# Patient Record
Sex: Female | Born: 1995 | Race: Black or African American | Hispanic: No | Marital: Single | Smoking: Never smoker
Health system: Southern US, Community
[De-identification: ages and names within clinical notes are randomized; demographics above are authoritative.]

## PROBLEM LIST (undated history)

## (undated) DIAGNOSIS — R011 Cardiac murmur, unspecified: Secondary | ICD-10-CM

## (undated) DIAGNOSIS — O009 Unspecified ectopic pregnancy without intrauterine pregnancy: Secondary | ICD-10-CM

## (undated) HISTORY — DX: Cardiac murmur, unspecified: R01.1

## (undated) HISTORY — DX: Unspecified ectopic pregnancy without intrauterine pregnancy: O00.90

---

## 2017-06-30 ENCOUNTER — Emergency Department: Payer: Medicaid - Out of State

## 2017-06-30 ENCOUNTER — Emergency Department
Admission: EM | Admit: 2017-06-30 | Discharge: 2017-06-30 | Disposition: A | Discharge status: Home / Self Care | Payer: Medicaid - Out of State | Attending: Emergency Medicine | Admitting: Emergency Medicine

## 2017-06-30 ENCOUNTER — Encounter: Payer: Self-pay | Admitting: Emergency Medicine

## 2017-06-30 DIAGNOSIS — Z3A01 Less than 8 weeks gestation of pregnancy: Secondary | ICD-10-CM | POA: Diagnosis not present

## 2017-06-30 DIAGNOSIS — O0000 Abdominal pregnancy without intrauterine pregnancy: Secondary | ICD-10-CM | POA: Insufficient documentation

## 2017-06-30 DIAGNOSIS — O208 Other hemorrhage in early pregnancy: Secondary | ICD-10-CM | POA: Diagnosis present

## 2017-06-30 DIAGNOSIS — O00101 Right tubal pregnancy without intrauterine pregnancy: Secondary | ICD-10-CM

## 2017-06-30 LAB — WET PREP, GENITAL
Clue Cells Wet Prep HPF POC: NONE SEEN
SPERM: NONE SEEN
TRICH WET PREP: NONE SEEN
YEAST WET PREP: NONE SEEN

## 2017-06-30 LAB — POCT PREGNANCY, URINE
PREG TEST UR: POSITIVE — AB
Preg Test, Ur: POSITIVE — AB

## 2017-06-30 LAB — BASIC METABOLIC PANEL
Anion gap: 9 (ref 5–15)
BUN: 10 mg/dL (ref 6–20)
CHLORIDE: 105 mmol/L (ref 101–111)
CO2: 22 mmol/L (ref 22–32)
CREATININE: 0.63 mg/dL (ref 0.44–1.00)
Calcium: 9.5 mg/dL (ref 8.9–10.3)
GFR calc Af Amer: >60 mL/min (ref >60)
GFR calc non Af Amer: >60 mL/min (ref >60)
GLUCOSE: 96 mg/dL (ref 65–99)
Potassium: 3.9 mmol/L (ref 3.5–5.1)
SODIUM: 136 mmol/L (ref 135–145)

## 2017-06-30 LAB — URINALYSIS, COMPLETE (UACMP) WITH MICROSCOPIC
BACTERIA UA: NONE SEEN
BILIRUBIN URINE: NEGATIVE
Glucose, UA: NEGATIVE mg/dL
KETONES UR: 20 mg/dL — AB
Nitrite: NEGATIVE
PROTEIN: 100 mg/dL — AB
SPECIFIC GRAVITY, URINE: 1.03 (ref 1.005–1.030)
pH: 6 (ref 5.0–8.0)

## 2017-06-30 LAB — CBC
HCT: 38.4 % (ref 35.0–47.0)
HEMOGLOBIN: 12.5 g/dL (ref 12.0–16.0)
MCH: 27.4 pg (ref 26.0–34.0)
MCHC: 32.6 g/dL (ref 32.0–36.0)
MCV: 84.3 fL (ref 80.0–100.0)
PLATELETS: 275 10*3/uL (ref 150–440)
RBC: 4.56 MIL/uL (ref 3.80–5.20)
RDW: 14.2 % (ref 11.5–14.5)
WBC: 6.8 10*3/uL (ref 3.6–11.0)

## 2017-06-30 LAB — ABO/RH: ABO/RH(D): A NEG

## 2017-06-30 LAB — CHLAMYDIA/NGC RT PCR (ARMC ONLY)
Chlamydia Tr: NOT DETECTED
N GONORRHOEAE: NOT DETECTED

## 2017-06-30 LAB — ANTIBODY SCREEN: Antibody Screen: NEGATIVE

## 2017-06-30 LAB — HCG, QUANTITATIVE, PREGNANCY: HCG, BETA CHAIN, QUANT, S: 13363 m[IU]/mL — AB (ref <5)

## 2017-06-30 MED ORDER — RHO D IMMUNE GLOBULIN 1500 UNIT/2ML IJ SOSY
300.0000 ug | PREFILLED_SYRINGE | Freq: Once | INTRAMUSCULAR | Status: AC
  Administered 2017-06-30: 300 ug via INTRAMUSCULAR
  Filled 2017-06-30: qty 2

## 2017-06-30 MED ORDER — METHOTREXATE SODIUM CHEMO INJECTION 50 MG/2ML
85.0000 mg | Freq: Once | INTRAMUSCULAR | Status: AC
  Administered 2017-06-30: 85 mg via INTRAMUSCULAR
  Filled 2017-06-30: qty 3.4

## 2017-06-30 MED ORDER — METHOTREXATE INJECTION FOR WOMEN'S HOSPITAL
50.0000 mg/m2 | Freq: Once | INTRAMUSCULAR | Status: DC
  Filled 2017-06-30: qty 1.7

## 2017-06-30 NOTE — ED Provider Notes (Signed)
[facility] Emergency Department Provider Note  ____________________________________________   First MD Initiated Contact with Patient 06/30/17 1822     (approximate)  I have reviewed the triage vital signs and the nursing notes.   HISTORY  Chief Complaint Vaginal Bleeding   HPI Teresa Bishop is a 21 y.o. female who is [redacted] weeks pregnant and a G2 P1 complaining of 3 days of vaginal bleeding and cramping to the lower abdomen.  She says that she has had several clots.  No pain at this time.  No issues with previous pregnancies.  Denies any discharge or burning with urination.  History reviewed. No pertinent past medical history.  There are no active problems to display for this patient.   History reviewed. No pertinent surgical history.  Prior to Admission medications   Not on File    Allergies Codeine  No family history on file.  Social History Social History   Tobacco Use  . Smoking status: Never Smoker  . Smokeless tobacco: Never Used  Substance Use Topics  . Alcohol use: No    Frequency: Never  . Drug use: No    Review of Systems  Constitutional: No fever/chills Eyes: No visual changes. ENT: No sore throat. Cardiovascular: Denies chest pain. Respiratory: Denies shortness of breath. Gastrointestinal: No nausea, no vomiting.  No diarrhea.  No constipation. Genitourinary: As above  musculoskeletal: Negative for back pain. Skin: Negative for rash. Neurological: Negative for headaches, focal weakness or numbness.   ____________________________________________   PHYSICAL EXAM:  VITAL SIGNS: ED Triage Vitals [06/30/17 1738]  Enc Vitals Group     BP 117/72     Pulse Rate 96     Resp 15     Temp 98.5 F (36.9 C)     Temp Source Oral     SpO2 100 %     Weight 140 lb (63.5 kg)     Height 5\' 7"  (1.702 m)     Head Circumference      Peak Flow      Pain Score 7     Pain Loc      Pain Edu?      Excl. in GC?      Constitutional: Alert and oriented. Well appearing and in no acute distress. Eyes: Conjunctivae are normal.  Head: Atraumatic. Nose: No congestion/rhinnorhea. Mouth/Throat: Mucous membranes are moist.  Neck: No stridor.   Cardiovascular: Normal rate, regular rhythm. Grossly normal heart sounds.   Respiratory: Normal respiratory effort.  No retractions. Lungs CTAB. Gastrointestinal: Soft and nontender. No distention. No CVA tenderness. Genitourinary: Normal external exam without any lesions.  Several small blood clots in the vault but without any active bleeding from the cervix on speculum exam.  No discharge visualized.  Bimanual exam with closed cervix.  No CMT.  No uterine or adnexal tenderness nor masses. Musculoskeletal: No lower extremity tenderness nor edema.  No joint effusions. Neurologic:  Normal speech and language. No gross focal neurologic deficits are appreciated. Skin:  Skin is warm, dry and intact. No rash noted. Psychiatric: Mood and affect are normal. Speech and behavior are normal.  ____________________________________________   LABS (all labs ordered are listed, but only abnormal results are displayed)  Labs Reviewed  WET PREP, GENITAL - Abnormal; Notable for the following components:      Result Value   WBC, Wet Prep HPF POC MANY (*)    All other components within normal limits  URINALYSIS, COMPLETE (UACMP) WITH MICROSCOPIC - Abnormal; Notable for the  following components:   Color, Urine YELLOW (*)    APPearance HAZY (*)    Hgb urine dipstick LARGE (*)    Ketones, ur 20 (*)    Protein, ur 100 (*)    Leukocytes, UA TRACE (*)    Squamous Epithelial / LPF 0-5 (*)    All other components within normal limits  HCG, QUANTITATIVE, PREGNANCY - Abnormal; Notable for the following components:   hCG, Beta Chain, Quant, S 13,363 (*)    All other components within normal limits  POCT PREGNANCY, URINE - Abnormal; Notable for the following components:   Preg Test, Ur  POSITIVE (*)    All other components within normal limits  POCT PREGNANCY, URINE - Abnormal; Notable for the following components:   Preg Test, Ur POSITIVE (*)    All other components within normal limits  CHLAMYDIA/NGC RT PCR ([facility] ONLY)  CBC  BASIC METABOLIC PANEL  POC URINE PREG, ED  ABO/RH   ____________________________________________  EKG   ____________________________________________  RADIOLOGY  Patient consistent with ectopic pregnancy in the right fallopian tube or interstitial location.  No free fluid within the pelvis. ____________________________________________   PROCEDURES  Procedure(s) performed:   Procedures  Critical Care performed:   ____________________________________________   INITIAL IMPRESSION / ASSESSMENT AND PLAN / ED COURSE  Pertinent labs & imaging results that were available during my care of the patient were reviewed by me and considered in my medical decision making (see chart for details).  Differential diagnosis includes, but is not limited to, threatened miscarriage, incomplete miscarriage, normal bleeding from an early trimester pregnancy, ectopic pregnancy, , blighted ovum, vaginal/cervical trauma, subchorionic hemorrhage/hematoma, etc.  No previous records on file for review.    ----------------------------------------- 7:30 PM on 06/30/2017 -----------------------------------------  Patient with a negative blood type.  Will be given RhoGam.  Discussed this with the patient and the reason why we are giving it.  Also with trace leukocytes but also with squamous epithelial cells in the urine.  Unlikely to be UTI.  Will hold antibiotics at this time.  ----------------------------------------- 9:14 PM on 06/30/2017 -----------------------------------------  Discussed the case with Dr. Logan BoresEvans of on-call OB/GYN who says that he will be evaluating the CT scan to see if the patient is a methotrexate candidate versus a  candidate for surgery.  The patient says that she does have a history of chlamydia.  I discussed with the diagnosis and the possible treatment plans.  We are awaiting word from Dr. Logan BoresEvans as of now.  ----------------------------------------- 10:14 PM on 06/30/2017 -----------------------------------------  Patient at this time without any further complaints.  She has been evaluated by Dr. Logan BoresEvans in the emergency department who has ordered methotrexate for the patient to be given IM prior to leaving.  He is recommending a repeat draw of her hCG level to Saturday in the emergency department and then follow-up with him this coming Wednesday in the office.  The patient was counseled to return immediately to the emergency department for any worsening or concerning symptoms, especially lower abdominal pain, increased bleeding and lightheadedness.  She is understanding of the plan willing to comply.  The patient was also counseled by Dr. Logan BoresEvans. ____________________________________________   FINAL CLINICAL IMPRESSION(S) / ED DIAGNOSES  Ectopic pregnancy.    NEW MEDICATIONS STARTED DURING THIS VISIT:  This SmartLink is deprecated. Use AVSMEDLIST instead to display the medication list for a patient.   Note:  This document was prepared using Dragon voice recognition software and may include unintentional dictation errors.  Myrna BlazerSchaevitz, Katina Remick Matthew, MD 06/30/17 2215

## 2017-06-30 NOTE — ED Notes (Signed)
Pt to ed with c/o vaginal cramping x 5 days, also vaginal spotting x 3 days.  G2 P1.  Pt reports mild bright red vaginal bleeding mostly when urinating.

## 2017-06-30 NOTE — ED Triage Notes (Signed)
Patient presents to ED via POV from home with c/o vaginal bleeding x 2 days. Patient reports cramping and lower back pain. Patient is [redacted] weeks pregnant. Patient denies saturating pads. Patient does report passing clots.

## 2017-06-30 NOTE — Progress Notes (Signed)
Patient ID: Teresa MoBrandi Minniear, female   DOB: 02/15/1996, 21 y.o.   MRN: [id number]     Consult Note  Requesting Attending Physician :  Myrna BlazerSchaevitz, Katalyn Matin Matthew* Service Requesting Consult : ED  Assessment/Recommendations:  Teresa Bishop is a 21 y.o. female seen in consultation at the request of Pershing ProudSchaevitz, Myra RudeDavid Matthew* regarding possible ectopic pregnancy.   U/S c/w RIGHT tubal ectopic pregnancy.  2.4 cm in diameter - no cardiac activity.  Plan:  Discussed r/b mtx with patient.  All questions answered.  Will give mtx tonight.  Precautions discussed.  Expectations of mtx reviewed.   F/U quant here in ED on Sat. [facility]   @HPIBEGIN @:  Pt presented with vaginal bleeding and lower back pain.   Found to have a positive HCG.  13K.  Pt reports h/o CT - treated in past.   U/S = The uterus is anteverted and appears unremarkable. The endometrium measures 9 mm in thickness. No intrauterine pregnancy identified. Small amount of debris or blood product is noted within the canal.  There is a 2.4 x 2.0 x 2.1 cm complex mass with solid echogenic periphery and central cystic component in the region of the right adnexa close to the right cornua most consistent with an ectopic pregnancy, likely tubal or interstitial in location. A 2 mm linear echogenic structure within the central cystic component of this mass may be artifactual or represent a fetal pole. If this structure is a true fetal pole a corresponds to a 5 weeks, 5 days gestational age. No definite cardiac activity was detected.  The ovaries are unremarkable.  No free fluid within the pelvis.   Obstetric History:  OB History  Gravida Para Term Preterm AB Living  1            SAB TAB Ectopic Multiple Live Births               # Outcome Date GA Lbr Len/2nd Weight Sex Delivery Anes PTL Lv  1 Current               GYN History:  Hx of STIs: Yes   CT  Social History: Social History    Socioeconomic History  . Marital status: Single    Spouse name: None  . Number of children: None  . Years of education: None  . Highest education level: None  Social Needs  . Financial resource strain: None  . Food insecurity - worry: None  . Food insecurity - inability: None  . Transportation needs - medical: None  . Transportation needs - non-medical: None  Occupational History  . None  Tobacco Use  . Smoking status: Never Smoker  . Smokeless tobacco: Never Used  Substance and Sexual Activity  . Alcohol use: No    Frequency: Never  . Drug use: No  . Sexual activity: None  Other Topics Concern  . None  Social History Narrative  . None    Family History: family history is not on file.  Review of Systems: Non-contrib or otherwise noted in HPI.  Objective :  Vital signs in last 24 hours: Temp:  [98.5 F (36.9 C)] 98.5 F (36.9 C) (11/20 1738) Pulse Rate:  [96] 96 (11/20 1738) Resp:  [15] 15 (11/20 1738) BP: (117)/(72) 117/72 (11/20 1738) SpO2:  [100 %] 100 % (11/20 1738) Weight:  [140 lb (63.5 kg)] 140 lb (63.5 kg) (11/20 1738)  Intake/Output last 3 shifts: No intake/output  data recorded.   Physical Exam: Abd:  Soft nt, no rebound or guarding no masses noted.  CMP noted.  Elonda Huskyavid J. Donavon Kimrey, M.D. 06/30/2017 9:56 PM

## 2017-06-30 NOTE — ED Notes (Signed)
Patient states she has not been to her OBGYN. Patient states, "We weren't going to go through with even having it".

## 2017-07-01 LAB — RHOGAM INJECTION: Unit division: 0

## 2017-07-04 ENCOUNTER — Emergency Department
Admission: EM | Admit: 2017-07-04 | Discharge: 2017-07-04 | Disposition: A | Discharge status: Home / Self Care | Payer: Medicaid - Out of State | Attending: Emergency Medicine | Admitting: Emergency Medicine

## 2017-07-04 ENCOUNTER — Other Ambulatory Visit: Payer: Self-pay

## 2017-07-04 ENCOUNTER — Encounter: Payer: Self-pay | Admitting: Emergency Medicine

## 2017-07-04 DIAGNOSIS — Z3A01 Less than 8 weeks gestation of pregnancy: Secondary | ICD-10-CM | POA: Diagnosis not present

## 2017-07-04 DIAGNOSIS — O209 Hemorrhage in early pregnancy, unspecified: Secondary | ICD-10-CM | POA: Insufficient documentation

## 2017-07-04 DIAGNOSIS — O009 Unspecified ectopic pregnancy without intrauterine pregnancy: Secondary | ICD-10-CM | POA: Diagnosis not present

## 2017-07-04 DIAGNOSIS — R1030 Lower abdominal pain, unspecified: Secondary | ICD-10-CM | POA: Diagnosis present

## 2017-07-04 DIAGNOSIS — Z885 Allergy status to narcotic agent status: Secondary | ICD-10-CM | POA: Insufficient documentation

## 2017-07-04 LAB — HCG, QUANTITATIVE, PREGNANCY: hCG, Beta Chain, Quant, S: 16832 m[IU]/mL — ABNORMAL HIGH (ref <5)

## 2017-07-04 MED ORDER — ACETAMINOPHEN 500 MG PO TABS
1000.0000 mg | ORAL_TABLET | Freq: Once | ORAL | Status: AC
  Administered 2017-07-04: 1000 mg via ORAL
  Filled 2017-07-04: qty 2

## 2017-07-04 MED ORDER — ONDANSETRON HCL 4 MG PO TABS: 4.0000 mg | ORAL_TABLET | Freq: Every day | ORAL | 1 refills | Status: DC | PRN

## 2017-07-04 MED ORDER — ONDANSETRON 4 MG PO TBDP
4.0000 mg | ORAL_TABLET | Freq: Once | ORAL | Status: AC
  Administered 2017-07-04: 4 mg via ORAL
  Filled 2017-07-04: qty 1

## 2017-07-04 MED ORDER — OXYCODONE-ACETAMINOPHEN 5-325 MG PO TABS: 1.0000 | ORAL_TABLET | Freq: Four times a day (QID) | ORAL | 0 refills | Status: DC | PRN

## 2017-07-04 NOTE — ED Provider Notes (Signed)
[facility] Emergency Department Provider Note       Time seen: ----------------------------------------- 11:34 AM on 07/04/2017 -----------------------------------------   I have reviewed the triage vital signs and the nursing notes.  HISTORY   Chief Complaint Abdominal Pain    HPI Teresa Bishop is a 21 y.o. female with a history of ectopic pregnancy who presents to the ED for reevaluation.  Patient states she [redacted] weeks pregnant was here recently for abdominal pain and vaginal bleeding 4 days ago.  She was given methotrexate and was supposed to come for a repeat hCG today.  She is G2 P1 AB 0.  She has had some nausea and mild pain today but otherwise denies complaints.  History reviewed. No pertinent past medical history.  There are no active problems to display for this patient.   History reviewed. No pertinent surgical history.  Allergies Codeine  Social History Social History   Tobacco Use  . Smoking status: Never Smoker  . Smokeless tobacco: Never Used  Substance Use Topics  . Alcohol use: No    Frequency: Never  . Drug use: No   Review of Systems Constitutional: Negative for fever. Cardiovascular: Negative for chest pain. Respiratory: Negative for shortness of breath. Gastrointestinal: Positive for mild abdominal pain and nausea Genitourinary: Negative for dysuria. Musculoskeletal: Negative for back pain. Skin: Negative for rash. Neurological: Negative for headaches, focal weakness or numbness.  All systems negative/normal/unremarkable except as stated in the HPI  ____________________________________________   PHYSICAL EXAM:  VITAL SIGNS: ED Triage Vitals  Enc Vitals Group     BP 07/04/17 1034 113/82     Pulse Rate 07/04/17 1034 95     Resp 07/04/17 1034 18     Temp 07/04/17 1034 98.1 F (36.7 C)     Temp Source 07/04/17 1034 Oral     SpO2 07/04/17 1034 99 %     Weight 07/04/17 1035 140 lb (63.5 kg)     Height 07/04/17  1035 5\' 7"  (1.702 m)     Head Circumference --      Peak Flow --      Pain Score 07/04/17 1034 3     Pain Loc --      Pain Edu? --      Excl. in GC? --     Constitutional: Alert and oriented. Well appearing and in no distress. Eyes: Conjunctivae are normal. Normal extraocular movements. Cardiovascular: Normal rate, regular rhythm. No murmurs, rubs, or gallops. Respiratory: Normal respiratory effort without tachypnea nor retractions. Breath sounds are clear and equal bilaterally. No wheezes/rales/rhonchi. Gastrointestinal: Soft and nontender. Normal bowel sounds Musculoskeletal: Nontender with normal range of motion in extremities. No lower extremity tenderness nor edema. Neurologic:  Normal speech and language. No gross focal neurologic deficits are appreciated.  Skin:  Skin is warm, dry and intact. No rash noted. Psychiatric: Mood and affect are normal. Speech and behavior are normal.  ____________________________________________  ED COURSE:  Pertinent labs & imaging results that were available during my care of the patient were reviewed by me and considered in my medical decision making (see chart for details). Patient presents for reevaluation for ectopic pregnancy, we will assess with labs as indicated   Procedures ____________________________________________   LABS (pertinent positives/negatives)  Labs Reviewed  HCG, QUANTITATIVE, PREGNANCY - Abnormal; Notable for the following components:      Result Value   hCG, Beta Chain, Quant, S 16,832 (*)    All other components within normal limits  ____________________________________________  DIFFERENTIAL  DIAGNOSIS   Ectopic, PID, TOA, normal IUP, ovarian cyst  FINAL ASSESSMENT AND PLAN  Ectopic pregnancy   Plan: Patient had presented for reevaluation for recent ectopic pregnancy with methotrexate treatment. Patient's labs do reveal a quantitative hCG that is not doubling normally.  I discussed with Dr. Logan BoresEvans will follow  up with the patient on Tuesday in his office for a blood draw and Wednesday for evaluation.   Emily FilbertWilliams, Daylen Hack E, MD   Note: This note was generated in part or whole with voice recognition software. Voice recognition is usually quite accurate but there are transcription errors that can and very often do occur. I apologize for any typographical errors that were not detected and corrected.     Emily FilbertWilliams, Lilo Wallington E, MD 07/04/17 [phone]

## 2017-07-04 NOTE — ED Triage Notes (Addendum)
[redacted] weeks pregnant, was for abdominal pain and vag bleeding 4 days ago. Told to return for repeat HCG. States was given methotrexate for probable ectopic pregnancy.

## 2017-07-04 NOTE — ED Notes (Signed)
Pt reports she was here recently for possible ectopic pregnancy. States she is here today to get her hcg levels checked.  Pt is G2.

## 2017-07-07 ENCOUNTER — Telehealth: Payer: Self-pay

## 2017-07-07 ENCOUNTER — Other Ambulatory Visit: Payer: Medicaid - Out of State

## 2017-07-07 ENCOUNTER — Other Ambulatory Visit: Payer: Self-pay | Admitting: Obstetrics and Gynecology

## 2017-07-07 DIAGNOSIS — O009 Unspecified ectopic pregnancy without intrauterine pregnancy: Secondary | ICD-10-CM

## 2017-07-07 NOTE — Telephone Encounter (Signed)
Ordered Beta Quant

## 2017-07-08 ENCOUNTER — Encounter: Payer: Medicaid - Out of State | Admitting: Obstetrics and Gynecology

## 2017-07-08 LAB — BETA HCG QUANT (REF LAB): hCG Quant: 13526 m[IU]/mL

## 2017-07-09 ENCOUNTER — Ambulatory Visit (INDEPENDENT_AMBULATORY_CARE_PROVIDER_SITE_OTHER): Payer: Medicaid - Out of State | Admitting: Obstetrics and Gynecology

## 2017-07-09 ENCOUNTER — Encounter: Payer: Self-pay | Admitting: Obstetrics and Gynecology

## 2017-07-09 ENCOUNTER — Telehealth: Payer: Self-pay

## 2017-07-09 VITALS — BP 100/55 | HR 72 | Ht 67.0 in | Wt 137.1 lb

## 2017-07-09 DIAGNOSIS — O00109 Unspecified tubal pregnancy without intrauterine pregnancy: Secondary | ICD-10-CM

## 2017-07-09 NOTE — Telephone Encounter (Signed)
-----   Message from Linzie Collinavid James Evans, MD sent at 07/09/2017  9:06 AM EST ----- Day 4 = 16,832               Day 7 = 13,526                     @ 20% fall -  This is adequate, pt will need f/u quant in 1 week and a visit with me.

## 2017-07-09 NOTE — Progress Notes (Signed)
HPI:      [name] is a 21 y.o. G1P0 who LMP was Patient's last menstrual period was 05/12/2017.  Subjective:   She presents today after being diagnosed with an ectopic pregnancy and treated with methotrexate 1 week ago.  Patient had quantitative beta hCGs drawn appropriately. She reports that she has begun to have vaginal bleeding and has some back pain but is otherwise doing well. She reports that when this is resolved she would like an IUD for birth control.    Hx: The following portions of the patient's history were reviewed and updated as appropriate:             She  has no past medical history on file. She does not have a problem list on file. She  has no past surgical history on file. Her family history is not on file. She  reports that  has never smoked. she has never used smokeless tobacco. She reports that she does not drink alcohol or use drugs. She is allergic to codeine.       Review of Systems:  Review of Systems  Constitutional: Denied constitutional symptoms, night sweats, recent illness, fatigue, fever, insomnia and weight loss.  Eyes: Denied eye symptoms, eye pain, photophobia, vision change and visual disturbance.  Ears/Nose/Throat/Neck: Denied ear, nose, throat or neck symptoms, hearing loss, nasal discharge, sinus congestion and sore throat.  Cardiovascular: Denied cardiovascular symptoms, arrhythmia, chest pain/pressure, edema, exercise intolerance, orthopnea and palpitations.  Respiratory: Denied pulmonary symptoms, asthma, pleuritic pain, productive sputum, cough, dyspnea and wheezing.  Gastrointestinal: Denied, gastro-esophageal reflux, melena, nausea and vomiting.  Genitourinary: Denied genitourinary symptoms including symptomatic vaginal discharge, pelvic relaxation issues, and urinary complaints.  Musculoskeletal: Denied musculoskeletal symptoms, stiffness, swelling, muscle weakness and myalgia.  Dermatologic: Denied dermatology symptoms, rash and  scar.  Neurologic: Denied neurology symptoms, dizziness, headache, neck pain and syncope.  Psychiatric: Denied psychiatric symptoms, anxiety and depression.  Endocrine: Denied endocrine symptoms including hot flashes and night sweats.   Meds:   Current Outpatient Medications on File Prior to Visit  Medication Sig Dispense Refill  . ondansetron (ZOFRAN) 4 MG tablet Take 1 tablet (4 mg total) by mouth daily as needed for nausea or vomiting. 20 tablet 1   No current facility-administered medications on file prior to visit.     Objective:     Vitals:   07/09/17 1130  BP: (!) 100/55  Pulse: 72    Quantitative beta hCGs reviewed and discussed directly with the patient.           Assessment:    G1P0 There are no active problems to display for this patient.    1. Tubal pregnancy without intrauterine pregnancy, unspecified laterality     Patient doing well after receiving methotrexate.  Quantitative beta hCG is dropping appropriately.  (20%)   Plan:            1.  Continue to follow weekly beta hCGs until 0.  2.  I have discussed this in detail with the patient.  She understands the necessity of continued surveillance. Orders No orders of the defined types were placed in this encounter.   No orders of the defined types were placed in this encounter.     F/U  Return for We will contact her with any abnormal test results. I spent 18 minutes with this patient of which greater than 50% was spent discussing ectopic pregnancy, quantitative beta hCG results, future follow-up, future birth control.  All patient's questions  answered.  Elonda Huskyavid J. Natania Finigan, M.D. 07/09/2017 11:58 AM

## 2017-07-09 NOTE — Telephone Encounter (Signed)
Pt will keep todays appointment.

## 2017-07-18 ENCOUNTER — Emergency Department: Payer: Medicaid - Out of State

## 2017-07-18 ENCOUNTER — Emergency Department
Admission: EM | Admit: 2017-07-18 | Discharge: 2017-07-18 | Disposition: A | Discharge status: Home / Self Care | Payer: Medicaid - Out of State | Attending: Emergency Medicine | Admitting: Emergency Medicine

## 2017-07-18 DIAGNOSIS — R1031 Right lower quadrant pain: Secondary | ICD-10-CM | POA: Insufficient documentation

## 2017-07-18 DIAGNOSIS — O00201 Right ovarian pregnancy without intrauterine pregnancy: Secondary | ICD-10-CM | POA: Insufficient documentation

## 2017-07-18 DIAGNOSIS — R1084 Generalized abdominal pain: Secondary | ICD-10-CM

## 2017-07-18 LAB — COMPREHENSIVE METABOLIC PANEL
ALBUMIN: 3.9 g/dL (ref 3.5–5.0)
ALK PHOS: 39 U/L (ref 38–126)
ALT: 12 U/L — AB (ref 14–54)
AST: 17 U/L (ref 15–41)
Anion gap: 7 (ref 5–15)
BILIRUBIN TOTAL: 0.5 mg/dL (ref 0.3–1.2)
BUN: 10 mg/dL (ref 6–20)
CALCIUM: 8.9 mg/dL (ref 8.9–10.3)
CO2: 23 mmol/L (ref 22–32)
CREATININE: 0.64 mg/dL (ref 0.44–1.00)
Chloride: 107 mmol/L (ref 101–111)
GFR calc Af Amer: >60 mL/min (ref >60)
GLUCOSE: 91 mg/dL (ref 65–99)
Potassium: 3.3 mmol/L — ABNORMAL LOW (ref 3.5–5.1)
Sodium: 137 mmol/L (ref 135–145)
TOTAL PROTEIN: 7 g/dL (ref 6.5–8.1)

## 2017-07-18 LAB — CBC
HEMATOCRIT: 33.4 % — AB (ref 35.0–47.0)
Hemoglobin: 11.2 g/dL — ABNORMAL LOW (ref 12.0–16.0)
MCH: 28.1 pg (ref 26.0–34.0)
MCHC: 33.6 g/dL (ref 32.0–36.0)
MCV: 83.7 fL (ref 80.0–100.0)
PLATELETS: 267 10*3/uL (ref 150–440)
RBC: 3.99 MIL/uL (ref 3.80–5.20)
RDW: 14.3 % (ref 11.5–14.5)
WBC: 5.2 10*3/uL (ref 3.6–11.0)

## 2017-07-18 LAB — HCG, QUANTITATIVE, PREGNANCY: HCG, BETA CHAIN, QUANT, S: 5732 m[IU]/mL — AB (ref <5)

## 2017-07-18 NOTE — Discharge Instructions (Signed)
It is critically important that you follow-up with the [facility] gynecologist this coming week for recheck.  Return to the emergency department sooner for any concerns whatsoever.  It was a pleasure to take care of you today, and thank you for coming to our emergency department.  If you have any questions or concerns before leaving please ask the nurse to grab me and I'm more than happy to go through your aftercare instructions again.  If you were prescribed any opioid pain medication today such as Norco, Vicodin, Percocet, morphine, hydrocodone, or oxycodone please make sure you do not drive when you are taking this medication as it can alter your ability to drive safely.  If you have any concerns once you are home that you are not improving or are in fact getting worse before you can make it to your follow-up appointment, please do not hesitate to call 911 and come back for further evaluation.  Merrily Brittle, MD  Results for orders placed or performed during the hospital encounter of 07/18/17  Comprehensive metabolic panel  Result Value Ref Range   Sodium 137 135 - 145 mmol/L   Potassium 3.3 (L) 3.5 - 5.1 mmol/L   Chloride 107 101 - 111 mmol/L   CO2 23 22 - 32 mmol/L   Glucose, Bld 91 65 - 99 mg/dL   BUN 10 6 - 20 mg/dL   Creatinine, Ser 5.28 0.44 - 1.00 mg/dL   Calcium 8.9 8.9 - 41.3 mg/dL   Total Protein 7.0 6.5 - 8.1 g/dL   Albumin 3.9 3.5 - 5.0 g/dL   AST 17 15 - 41 U/L   ALT 12 (L) 14 - 54 U/L   Alkaline Phosphatase 39 38 - 126 U/L   Total Bilirubin 0.5 0.3 - 1.2 mg/dL   GFR calc non Af Amer >60 >60 mL/min   GFR calc Af Amer >60 >60 mL/min   Anion gap 7 5 - 15  CBC  Result Value Ref Range   WBC 5.2 3.6 - 11.0 K/uL   RBC 3.99 3.80 - 5.20 MIL/uL   Hemoglobin 11.2 (L) 12.0 - 16.0 g/dL   HCT 24.4 (L) 01.0 - 27.2 %   MCV 83.7 80.0 - 100.0 fL   MCH 28.1 26.0 - 34.0 pg   MCHC 33.6 32.0 - 36.0 g/dL   RDW 53.6 64.4 - 03.4 %   Platelets 267 150 - 440 K/uL  hCG, quantitative, pregnancy   Result Value Ref Range   hCG, Beta Chain, Quant, S 5,732 (H) <5 mIU/mL   US Ob Comp Less 14 Wks  Result Date: 07/18/2017 CLINICAL DATA:  Ectopic pregnancy diagnosis 06/30/2017. Methotrexate treatment on 06/30/2017. Continued RIGHT lower quadrant pain. EXAM: OBSTETRIC <14 WK [place] AND TRANSVAGINAL OB US TECHNIQUE: Both transabdominal and transvaginal ultrasound examinations were performed for complete evaluation of the gestation as well as the maternal uterus, adnexal regions, and pelvic cul-de-sac. Transvaginal technique was performed to assess early pregnancy. COMPARISON:  Ultrasound 06/30/2017 FINDINGS: Intrauterine gestational sac: Not identified Yolk sac:  Not identified Embryo:  Not identified Subchorionic hemorrhage:  None visualized. Maternal uterus/adnexae: Rounded mass in the RIGHT adnexa is again demonstrated with mild of blood flow on color Doppler imaging. This mass measures 3.6 x 3.3 x 3.5 cm with a central sac-like component measuring 7 mm. The mass is in the same location as the presumed ectopic pregnancy on comparison exam. Mass measured 2.4 x 2.0 x 2.1 cm on comparison ultrasound with a 7 mm central sac-like lesion. The  vascularity was slightly higher on comparison exam. Overall this mass lesion of the RIGHT adnexa appears very similar although slightly enlarged. The mass is immediately adjacent to the uterus and may be within the cornu. The uterus appears to extend partially around the mass. No free-fluid. IMPRESSION: Persistent mass in the RIGHT adnexa / RIGHT cornu of the uterus with central sac-like structure and persistent vascularity. The size of lesion is increased mildly in the interval. The central sac-like portion is unchanged. Differential would include adnexal ectopic pregnancy versus corneal ectopic pregnancy. With mass lesion contiguous with the uterus would also consider leiomyoma. No free-fluid. Recommend OB GYN consultation. Electronically Signed   By: Genevive BiStewart  Edmunds M.D.    On: 07/18/2017 09:19   [place] Ob Comp Less 14 Wks  Result Date: 06/30/2017 CLINICAL DATA:  21 year old pregnant female with right lower quadrant pain and vaginal bleeding. LMP: 05/12/2017 corresponding to an estimated gestational age of [redacted] weeks, 0 days. EXAM: OBSTETRIC <14 WK US AND TRANSVAGINAL OB US TECHNIQUE: Both transabdominal and transvaginal ultrasound examinations were performed for complete evaluation of the gestation as well as the maternal uterus, adnexal regions, and pelvic cul-de-sac. Transvaginal technique was performed to assess early pregnancy. COMPARISON:  None. FINDINGS: The uterus is anteverted and appears unremarkable. The endometrium measures 9 mm in thickness. No intrauterine pregnancy identified. Small amount of debris or blood product is noted within the canal. There is a 2.4 x 2.0 x 2.1 cm complex mass with solid echogenic periphery and central cystic component in the region of the right adnexa close to the right cornua most consistent with an ectopic pregnancy, likely tubal or interstitial in location. A 2 mm linear echogenic structure within the central cystic component of this mass may be artifactual or represent a fetal pole. If this structure is a true fetal pole a corresponds to a 5 weeks, 5 days gestational age. No definite cardiac activity was detected. The ovaries are unremarkable. No free fluid within the pelvis. IMPRESSION: Findings most consistent with an ectopic pregnancy likely in the right fallopian tube or interstitial location. Clinical correlation and obstetrical consult is advised. A linear echogenic structure within the ectopic pregnancy may be artifactual or represent a fetal pole with an estimated gestational age of [redacted] weeks, 5 days. No cardiac activity. No free fluid within the pelvis. These results were called by telephone at the time of interpretation on 06/30/2017 at 8:49 pm to Dr. Gladstone PihAVID SCHAEVITZ , who verbally acknowledged these results. Electronically Signed    By: Elgie CollardArash  Radparvar M.D.   On: 06/30/2017 20:55   [place] Ob Transvaginal  Result Date: 07/18/2017 CLINICAL DATA:  Ectopic pregnancy diagnosis 06/30/2017. Methotrexate treatment on 06/30/2017. Continued RIGHT lower quadrant pain. EXAM: OBSTETRIC <14 WK US AND TRANSVAGINAL OB US TECHNIQUE: Both transabdominal and transvaginal ultrasound examinations were performed for complete evaluation of the gestation as well as the maternal uterus, adnexal regions, and pelvic cul-de-sac. Transvaginal technique was performed to assess early pregnancy. COMPARISON:  Ultrasound 06/30/2017 FINDINGS: Intrauterine gestational sac: Not identified Yolk sac:  Not identified Embryo:  Not identified Subchorionic hemorrhage:  None visualized. Maternal uterus/adnexae: Rounded mass in the RIGHT adnexa is again demonstrated with mild of blood flow on color Doppler imaging. This mass measures 3.6 x 3.3 x 3.5 cm with a central sac-like component measuring 7 mm. The mass is in the same location as the presumed ectopic pregnancy on comparison exam. Mass measured 2.4 x 2.0 x 2.1 cm on comparison ultrasound with a 7 mm central sac-like  lesion. The vascularity was slightly higher on comparison exam. Overall this mass lesion of the RIGHT adnexa appears very similar although slightly enlarged. The mass is immediately adjacent to the uterus and may be within the cornu. The uterus appears to extend partially around the mass. No free-fluid. IMPRESSION: Persistent mass in the RIGHT adnexa / RIGHT cornu of the uterus with central sac-like structure and persistent vascularity. The size of lesion is increased mildly in the interval. The central sac-like portion is unchanged. Differential would include adnexal ectopic pregnancy versus corneal ectopic pregnancy. With mass lesion contiguous with the uterus would also consider leiomyoma. No free-fluid. Recommend OB GYN consultation. Electronically Signed   By: Genevive BiStewart  Edmunds M.D.   On: 07/18/2017 09:19   [place] Ob  Transvaginal  Result Date: 06/30/2017 CLINICAL DATA:  21 year old pregnant female with right lower quadrant pain and vaginal bleeding. LMP: 05/12/2017 corresponding to an estimated gestational age of [redacted] weeks, 0 days. EXAM: OBSTETRIC <14 WK US AND TRANSVAGINAL OB US TECHNIQUE: Both transabdominal and transvaginal ultrasound examinations were performed for complete evaluation of the gestation as well as the maternal uterus, adnexal regions, and pelvic cul-de-sac. Transvaginal technique was performed to assess early pregnancy. COMPARISON:  None. FINDINGS: The uterus is anteverted and appears unremarkable. The endometrium measures 9 mm in thickness. No intrauterine pregnancy identified. Small amount of debris or blood product is noted within the canal. There is a 2.4 x 2.0 x 2.1 cm complex mass with solid echogenic periphery and central cystic component in the region of the right adnexa close to the right cornua most consistent with an ectopic pregnancy, likely tubal or interstitial in location. A 2 mm linear echogenic structure within the central cystic component of this mass may be artifactual or represent a fetal pole. If this structure is a true fetal pole a corresponds to a 5 weeks, 5 days gestational age. No definite cardiac activity was detected. The ovaries are unremarkable. No free fluid within the pelvis. IMPRESSION: Findings most consistent with an ectopic pregnancy likely in the right fallopian tube or interstitial location. Clinical correlation and obstetrical consult is advised. A linear echogenic structure within the ectopic pregnancy may be artifactual or represent a fetal pole with an estimated gestational age of [redacted] weeks, 5 days. No cardiac activity. No free fluid within the pelvis. These results were called by telephone at the time of interpretation on 06/30/2017 at 8:49 pm to Dr. Gladstone PihAVID SCHAEVITZ , who verbally acknowledged these results. Electronically Signed   By: Elgie CollardArash  Radparvar M.D.   On:  06/30/2017 20:55

## 2017-07-18 NOTE — ED Provider Notes (Signed)
[facility] Emergency Department Provider Note  ____________________________________________   None    (approximate)  I have reviewed the triage vital signs and the nursing notes.   HISTORY  Chief Complaint Abdominal Pain   HPI Teresa Bishop is a 21 y.o. female who self presents to the emergency department with several days of right lower abdominal discomfort radiating down her right leg.  No numbness or weakness.  Pain was insidious in onset.  Seems to be slightly worse with movement.  3 weeks ago she had a right-sided tubal ectopic pregnancy that was treated with methotrexate.  She had a subsequent follow-up with her OB gynecologist however has been lost to follow-up.  She had no fevers or chills.  No back pain.  No vaginal bleeding or dysuria.  Her pain is moderate severity and intermittent.  No past medical history on file.  There are no active problems to display for this patient.   No past surgical history on file.  Prior to Admission medications   Medication Sig Start Date End Date Taking? Authorizing Provider  ondansetron (ZOFRAN) 4 MG tablet Take 1 tablet (4 mg total) by mouth daily as needed for nausea or vomiting. 07/04/17   Emily FilbertWilliams, Jonathan E, MD    Allergies Codeine  No family history on file.  Social History Social History   Tobacco Use  . Smoking status: Never Smoker  . Smokeless tobacco: Never Used  Substance Use Topics  . Alcohol use: No    Frequency: Never  . Drug use: No    Review of Systems Constitutional: No fever/chills Eyes: No visual changes. ENT: No sore throat. Cardiovascular: Denies chest pain. Respiratory: Denies shortness of breath. Gastrointestinal: Positive for abdominal pain.  No nausea, no vomiting.  No diarrhea.  No constipation. Genitourinary: Negative for dysuria. Musculoskeletal: Negative for back pain. Skin: Negative for rash. Neurological: Negative for headaches, focal weakness or  numbness.   ____________________________________________   PHYSICAL EXAM:  VITAL SIGNS: ED Triage Vitals  Enc Vitals Group     BP 07/18/17 0600 113/65     Pulse Rate 07/18/17 0600 91     Resp 07/18/17 0600 16     Temp 07/18/17 0600 98.5 F (36.9 C)     Temp Source 07/18/17 0600 Oral     SpO2 07/18/17 0600 100 %     Weight 07/18/17 0601 137 lb (62.1 kg)     Height 07/18/17 0601 5\' 7"  (1.702 m)     Head Circumference --      Peak Flow --      Pain Score 07/18/17 0600 6     Pain Loc --      Pain Edu? --      Excl. in GC? --     Constitutional: Alert and oriented x4 joking laughing well-appearing nontoxic no diaphoresis speaks in full clear sentences Eyes: PERRL EOMI. Head: Atraumatic. Nose: No congestion/rhinnorhea. Mouth/Throat: No trismus Neck: No stridor.   Cardiovascular: Normal rate, regular rhythm. Grossly normal heart sounds.  Good peripheral circulation. Respiratory: Normal respiratory effort.  No retractions. Lungs CTAB and moving good air Gastrointestinal: Soft nondistended mild lower abdominal discomfort with no rebound or guarding no peritonitis no focality Musculoskeletal: No lower extremity edema   Neurologic:  Normal speech and language. No gross focal neurologic deficits are appreciated. Skin:  Skin is warm, dry and intact. No rash noted. Psychiatric: Mood and affect are normal. Speech and behavior are normal.    ____________________________________________   DIFFERENTIAL includes but  not limited to  Ectopic pregnancy, appendicitis, diverticulitis, urinary tract infection ____________________________________________   LABS (all labs ordered are listed, but only abnormal results are displayed)  Labs Reviewed  COMPREHENSIVE METABOLIC PANEL - Abnormal; Notable for the following components:      Result Value   Potassium 3.3 (*)    ALT 12 (*)    All other components within normal limits  CBC - Abnormal; Notable for the following components:    Hemoglobin 11.2 (*)    HCT 33.4 (*)    All other components within normal limits  HCG, QUANTITATIVE, PREGNANCY - Abnormal; Notable for the following components:   hCG, Beta Chain, Quant, S 5,732 (*)    All other components within normal limits    Blood work reviewed by me shows appropriately dropping hCG __________________________________________  EKG   ____________________________________________  RADIOLOGY  Pelvic ultrasound reviewed by me shows persistent mass in the right tube ____________________________________________   PROCEDURES  Procedure(s) performed: no  Procedures  Critical Care performed: no  Observation: no ____________________________________________   INITIAL IMPRESSION / ASSESSMENT AND PLAN / ED COURSE  Pertinent labs & imaging results that were available during my care of the patient were reviewed by me and considered in my medical decision making (see chart for details).  The patient arrives hemodynamically stable well-appearing with a relatively benign abdominal exam.  Her beta hCG is dropping and her pelvic ultrasound shows a persistent mass.  I will reach out to her OB gynecologist Dr. Logan BoresEvans now.  I discussed with Dr. Logan BoresEvans who feels that the patient's hCG is dropping appropriately and her ultrasound findings are consistent with what he would expect.  He would like to see the patient in clinic later on this week for recheck.  Strict return precautions have been given and the patient verbalized understanding and agreement with the plan.  She is able to eat and drink.      ____________________________________________   FINAL CLINICAL IMPRESSION(S) / ED DIAGNOSES  Final diagnoses:  Generalized abdominal pain  Right ovarian pregnancy without intrauterine pregnancy      NEW MEDICATIONS STARTED DURING THIS VISIT:  This SmartLink is deprecated. Use AVSMEDLIST instead to display the medication list for a patient.   Note:  This document was  prepared using Dragon voice recognition software and may include unintentional dictation errors.     Merrily Brittleifenbark, Doniel Maiello, MD 07/19/17 [phone]

## 2017-07-18 NOTE — ED Notes (Signed)
Patient transported to Ultrasound 

## 2017-07-18 NOTE — ED Triage Notes (Signed)
Pt states recent right sided ectopic pregnancy treated with methotrexate. Pt states she has right sided lower abd pain from rlq down anterior right leg to knee. Pt denies vomiting, diarrhea, states she has had nausea.

## 2017-07-26 ENCOUNTER — Encounter: Payer: Self-pay | Admitting: Emergency Medicine

## 2017-07-26 ENCOUNTER — Encounter: Payer: Self-pay | Admitting: Anesthesiology

## 2017-07-26 ENCOUNTER — Emergency Department
Admission: EM | Admit: 2017-07-26 | Discharge: 2017-07-26 | Disposition: A | Discharge status: Home / Self Care | Payer: Self-pay | Attending: Emergency Medicine | Admitting: Emergency Medicine

## 2017-07-26 ENCOUNTER — Encounter
Admission: EM | Disposition: A | Discharge status: Home / Self Care | Payer: Self-pay | Source: Home / Self Care | Attending: Emergency Medicine

## 2017-07-26 ENCOUNTER — Emergency Department: Payer: Self-pay

## 2017-07-26 ENCOUNTER — Other Ambulatory Visit: Payer: Self-pay

## 2017-07-26 DIAGNOSIS — O00101 Right tubal pregnancy without intrauterine pregnancy: Secondary | ICD-10-CM | POA: Insufficient documentation

## 2017-07-26 DIAGNOSIS — R102 Pelvic and perineal pain: Secondary | ICD-10-CM

## 2017-07-26 DIAGNOSIS — Z3A01 Less than 8 weeks gestation of pregnancy: Secondary | ICD-10-CM | POA: Insufficient documentation

## 2017-07-26 LAB — CBC
HCT: 31.8 % — ABNORMAL LOW (ref 35.0–47.0)
HEMOGLOBIN: 10.5 g/dL — AB (ref 12.0–16.0)
MCH: 27.7 pg (ref 26.0–34.0)
MCHC: 33 g/dL (ref 32.0–36.0)
MCV: 83.8 fL (ref 80.0–100.0)
PLATELETS: 270 10*3/uL (ref 150–440)
RBC: 3.8 MIL/uL (ref 3.80–5.20)
RDW: 14 % (ref 11.5–14.5)
WBC: 5.6 10*3/uL (ref 3.6–11.0)

## 2017-07-26 LAB — COMPREHENSIVE METABOLIC PANEL
ALBUMIN: 3.6 g/dL (ref 3.5–5.0)
ALK PHOS: 42 U/L (ref 38–126)
ALT: 12 U/L — AB (ref 14–54)
ANION GAP: 8 (ref 5–15)
AST: 20 U/L (ref 15–41)
BUN: 12 mg/dL (ref 6–20)
CALCIUM: 9.1 mg/dL (ref 8.9–10.3)
CHLORIDE: 106 mmol/L (ref 101–111)
CO2: 26 mmol/L (ref 22–32)
CREATININE: 0.63 mg/dL (ref 0.44–1.00)
GFR calc Af Amer: >60 mL/min (ref >60)
GFR calc non Af Amer: >60 mL/min (ref >60)
GLUCOSE: 75 mg/dL (ref 65–99)
Potassium: 4.1 mmol/L (ref 3.5–5.1)
SODIUM: 140 mmol/L (ref 135–145)
Total Bilirubin: 0.2 mg/dL — ABNORMAL LOW (ref 0.3–1.2)
Total Protein: 6.8 g/dL (ref 6.5–8.1)

## 2017-07-26 LAB — HCG, QUANTITATIVE, PREGNANCY: HCG, BETA CHAIN, QUANT, S: 3145 m[IU]/mL — AB (ref <5)

## 2017-07-26 SURGERY — LAPAROSCOPY, WITH ECTOPIC PREGNANCY SURGICAL TREATMENT
Anesthesia: General | Laterality: Right | Wound class: Clean Contaminated

## 2017-07-26 MED ORDER — ONDANSETRON HCL 4 MG/2ML IJ SOLN
4.0000 mg | Freq: Once | INTRAMUSCULAR | Status: AC
  Administered 2017-07-26: 4 mg via INTRAVENOUS

## 2017-07-26 MED ORDER — SODIUM CHLORIDE 0.9 % IV BOLUS (SEPSIS)
1000.0000 mL | Freq: Once | INTRAVENOUS | Status: AC
  Administered 2017-07-26: 1000 mL via INTRAVENOUS

## 2017-07-26 MED ORDER — MORPHINE SULFATE (PF) 4 MG/ML IV SOLN
4.0000 mg | Freq: Once | INTRAVENOUS | Status: AC
  Administered 2017-07-26: 4 mg via INTRAVENOUS
  Filled 2017-07-26: qty 1

## 2017-07-26 MED ORDER — MORPHINE SULFATE (PF) 2 MG/ML IV SOLN
INTRAVENOUS | Status: AC
  Administered 2017-07-26: 2 mg via INTRAVENOUS
  Filled 2017-07-26: qty 1

## 2017-07-26 MED ORDER — FENTANYL CITRATE (PF) 100 MCG/2ML IJ SOLN
INTRAMUSCULAR | Status: AC
  Filled 2017-07-26: qty 2

## 2017-07-26 MED ORDER — MORPHINE SULFATE (PF) 2 MG/ML IV SOLN
2.0000 mg | Freq: Once | INTRAVENOUS | Status: AC
  Administered 2017-07-26: 2 mg via INTRAVENOUS

## 2017-07-26 MED ORDER — MORPHINE SULFATE (PF) 2 MG/ML IV SOLN
2.0000 mg | Freq: Once | INTRAVENOUS | Status: AC
  Administered 2017-07-26: 2 mg via INTRAVENOUS
  Filled 2017-07-26: qty 1

## 2017-07-26 MED ORDER — ONDANSETRON HCL 4 MG/2ML IJ SOLN
INTRAMUSCULAR | Status: AC
  Administered 2017-07-26: 4 mg via INTRAVENOUS
  Filled 2017-07-26: qty 2

## 2017-07-26 MED ORDER — PROPOFOL 10 MG/ML IV BOLUS
INTRAVENOUS | Status: AC
  Filled 2017-07-26: qty 20

## 2017-07-26 MED ORDER — MIDAZOLAM HCL 2 MG/2ML IJ SOLN
INTRAMUSCULAR | Status: AC
  Filled 2017-07-26: qty 2

## 2017-07-26 MED ORDER — METHOTREXATE INJECTION FOR WOMEN'S HOSPITAL
50.0000 mg/m2 | Freq: Once | INTRAMUSCULAR | Status: AC
  Administered 2017-07-26: 85 mg via INTRAMUSCULAR
  Filled 2017-07-26: qty 1.7

## 2017-07-26 SURGICAL SUPPLY — 36 items
BLADE SURG SZ11 CARB STEEL (BLADE) ×3 IMPLANT
CANISTER SUCT 1200ML W/VALVE (MISCELLANEOUS) ×3 IMPLANT
CATH ROBINSON RED A/P 16FR (CATHETERS) ×3 IMPLANT
CHLORAPREP W/TINT 26ML (MISCELLANEOUS) ×3 IMPLANT
CORD MONOPOLAR M/FML 12FT (MISCELLANEOUS) IMPLANT
DERMABOND ADVANCED (GAUZE/BANDAGES/DRESSINGS) ×2
DERMABOND ADVANCED .7 DNX12 (GAUZE/BANDAGES/DRESSINGS) ×1 IMPLANT
GLOVE BIO SURGEON STRL SZ 6.5 (GLOVE) ×2 IMPLANT
GLOVE BIO SURGEON STRL SZ8 (GLOVE) ×3 IMPLANT
GLOVE BIO SURGEONS STRL SZ 6.5 (GLOVE) ×1
GLOVE INDICATOR 7.0 STRL GRN (GLOVE) ×3 IMPLANT
GLOVE INDICATOR 8.0 STRL GRN (GLOVE) ×3 IMPLANT
GOWN STRL REUS W/ TWL LRG LVL3 (GOWN DISPOSABLE) ×2 IMPLANT
GOWN STRL REUS W/TWL LRG LVL3 (GOWN DISPOSABLE) ×4
GOWN STRL REUS W/TWL XL LVL4 (GOWN DISPOSABLE) ×3 IMPLANT
IRRIGATION STRYKERFLOW (MISCELLANEOUS) ×1 IMPLANT
IRRIGATOR STRYKERFLOW (MISCELLANEOUS) ×3
IV LACTATED RINGERS 1000ML (IV SOLUTION) ×3 IMPLANT
KIT PINK PAD W/HEAD ARE REST (MISCELLANEOUS) ×3
KIT PINK PAD W/HEAD ARM REST (MISCELLANEOUS) ×1 IMPLANT
KIT RM TURNOVER CYSTO AR (KITS) ×3 IMPLANT
NS IRRIG 500ML POUR BTL (IV SOLUTION) ×3 IMPLANT
PACK GYN LAPAROSCOPIC (MISCELLANEOUS) ×3 IMPLANT
PAD OB MATERNITY 4.3X12.25 (PERSONAL CARE ITEMS) ×3 IMPLANT
PAD PREP 24X41 OB/GYN DISP (PERSONAL CARE ITEMS) ×3 IMPLANT
POUCH ENDO CATCH 10MM SPEC (MISCELLANEOUS) IMPLANT
SCISSORS METZENBAUM CVD 33 (INSTRUMENTS) IMPLANT
SHEARS HARMONIC ACE PLUS 36CM (ENDOMECHANICALS) IMPLANT
SLEEVE ENDOPATH XCEL 5M (ENDOMECHANICALS) ×3 IMPLANT
SUT VIC AB 3-0 SH 27 (SUTURE)
SUT VIC AB 3-0 SH 27X BRD (SUTURE) IMPLANT
SUT VICRYL 0 AB UR-6 (SUTURE) ×3 IMPLANT
TROCAR ENDO BLADELESS 11MM (ENDOMECHANICALS) ×3 IMPLANT
TROCAR XCEL NON-BLD 5MMX100MML (ENDOMECHANICALS) ×3 IMPLANT
TROCAR XCEL UNIV SLVE 11M 100M (ENDOMECHANICALS) ×3 IMPLANT
TUBING INSUFFLATION (TUBING) ×3 IMPLANT

## 2017-07-26 NOTE — ED Notes (Signed)
Patient returned from radiology

## 2017-07-26 NOTE — ED Provider Notes (Addendum)
methotrexate comes up patient is prepped to give the injection in the upper outer quadrant of the right buttocks. There  is very little muscle there. the area was prepped with an alcohol wipe, I hubbed the needle and injected the methotrexate.   Arnaldo NatalMalinda, Paul F, MD 07/26/17 1554    Arnaldo NatalMalinda, Paul F, MD 07/26/17 1556

## 2017-07-26 NOTE — ED Notes (Signed)
Dr. Valentino Saxonherry at bedside with patient.

## 2017-07-26 NOTE — ED Notes (Signed)
Patient states the last PO intake was at 8:00 this morning.  Patient educated by MD in regards to US report and pateint verbalized understanding of this information.  Informed patient that Dr. Valentino Saxonherry will be down in the ED to round on her shortly.

## 2017-07-26 NOTE — ED Provider Notes (Signed)
[facility] Emergency Department Provider Note    First MD Initiated Contact with Patient 07/26/17 0915     (approximate)  I have reviewed the triage vital signs and the nursing notes.   HISTORY  Chief Complaint Pelvic Pain    HPI Teresa Bishop is a 21 y.o. female G2 P1 status post right ectopic pregnancy 06/30/2017 treated with methotrexate on  returns to the emergency department with complaint of worsening right pelvic pain is currently 8 out of 10.  Patient states vaginal bleeding stopped however pain has worsened.  Patient denies any fever no nausea.   Past medical history Right ectopic pregnancy There are no active problems to display for this patient.   Past Surgical History None  Prior to Admission medications   Medication Sig Start Date End Date Taking? Authorizing Provider  ondansetron (ZOFRAN) 4 MG tablet Take 1 tablet (4 mg total) by mouth daily as needed for nausea or vomiting. Patient not taking: Reported on 07/26/2017 07/04/17   Emily FilbertWilliams, Jonathan E, MD    Allergies Codeine  Family History  Problem Relation Age of Onset  . Hypertension Neg Hx   . Cancer Neg Hx   . Diabetes Neg Hx     Social History Social History   Tobacco Use  . Smoking status: Never Smoker  . Smokeless tobacco: Never Used  Substance Use Topics  . Alcohol use: No    Frequency: Never  . Drug use: No    Review of Systems Constitutional: No fever/chills Eyes: No visual changes. ENT: No sore throat. Cardiovascular: Denies chest pain. Respiratory: Denies shortness of breath. Gastrointestinal: No abdominal pain.  No nausea, no vomiting.  No diarrhea.  No constipation. Genitourinary: Negative for dysuria.  Visit for right pelvic pain Musculoskeletal: Negative for neck pain.  Negative for back pain. Integumentary: Negative for rash. Neurological: Negative for headaches, focal weakness or  numbness.   ____________________________________________   PHYSICAL EXAM:  VITAL SIGNS: ED Triage Vitals [07/26/17 0914]  Enc Vitals Group     BP 118/64     Pulse Rate 93     Resp 18     Temp 98.5 F (36.9 C)     Temp Source Oral     SpO2 100 %     Weight 62.1 kg (137 lb)     Height 1.702 m (5\' 7" )     Head Circumference      Peak Flow      Pain Score 9     Pain Loc      Pain Edu?      Excl. in GC?     Constitutional: Alert and oriented. Well appearing and in no acute distress. Eyes: Conjunctivae are normal. Head: Atraumatic. Mouth/Throat: Mucous membranes are moist.  Oropharynx non-erythematous. Neck: No stridor.   Cardiovascular: Normal rate, regular rhythm. Good peripheral circulation. Grossly normal heart sounds. Respiratory: Normal respiratory effort.  No retractions. Lungs CTAB. Gastrointestinal: Soft and nontender. No distention.  Pain with right pelvic palpation  Musculoskeletal: No lower extremity tenderness nor edema. No gross deformities of extremities. Neurologic:  Normal speech and language. No gross focal neurologic deficits are appreciated.  Skin:  Skin is warm, dry and intact. No rash noted. Psychiatric: Mood and affect are normal. Speech and behavior are normal.  ____________________________________________   LABS (all labs ordered are listed, but only abnormal results are displayed)  Labs Reviewed  HCG, QUANTITATIVE, PREGNANCY - Abnormal; Notable for the following components:      Result Value  hCG, Beta Chain, Quant, S 3,145 (*)    All other components within normal limits  CBC - Abnormal; Notable for the following components:   Hemoglobin 10.5 (*)    HCT 31.8 (*)    All other components within normal limits  COMPREHENSIVE METABOLIC PANEL - Abnormal; Notable for the following components:   ALT 12 (*)    Total Bilirubin 0.2 (*)    All other components within normal limits     RADIOLOGY I, Okreek N BROWN, personally viewed and  evaluated these images (plain radiographs) as part of my medical decision making, as well as reviewing the written report by the radiologist.  [place] Ob Comp Less 14 Wks  Result Date: 07/26/2017 CLINICAL DATA:  Persistent pelvic pain. Recent diagnosis of right ectopic. EXAM: OBSTETRIC <14 WK US AND TRANSVAGINAL OB US TECHNIQUE: Both transabdominal and transvaginal ultrasound examinations were performed for complete evaluation of the gestation as well as the maternal uterus, adnexal regions, and pelvic cul-de-sac. Transvaginal technique was performed to assess early pregnancy. COMPARISON:  07/18/2017 and 06/30/2017 FINDINGS: Intrauterine gestational sac: None Yolk sac:  Not visualize Embryo:  Not visualized Maternal uterus/adnexae: Subchorionic hemorrhage: Not applicable Right ovary: Contains a corpus luteal cyst measuring 1.9 x 1.2 x 1.3 cm Left ovary: Normal Other :There is a mass in the right adnexa measuring 4.4 x 3.0 x 5.0 cm (volume = 35 cm^3). Previously 2.4 x 2.0 x 2.1 cm (volume = 5.3 cm^3). This contains an internal fluid collection measuring 8 mm which corresponds with a 5 week 4 day gestation. Free fluid:  Small amount of free fluid noted. IMPRESSION: 1. There is a persistent mass in the right adnexa with what appears to represent a small gestational sac. This has increased in volume when compared with 07/18/2017. Cannot rule out persistent ectopic pregnancy. 2. No intrauterine gestation identified. 3. Small volume of free fluid within the pelvis. Critical Value/emergent results were called by telephone at the time of interpretation on 07/26/2017 at 1:42 pm to Dr. Bayard MalesANDOLPH BROWN , who verbally acknowledged these results. Electronically Signed   By: Signa Kellaylor  Stroud M.D.   On: 07/26/2017 13:42   [place] Ob Transvaginal  Result Date: 07/26/2017 CLINICAL DATA:  Persistent pelvic pain. Recent diagnosis of right ectopic. EXAM: OBSTETRIC <14 WK US AND TRANSVAGINAL OB US TECHNIQUE: Both transabdominal and  transvaginal ultrasound examinations were performed for complete evaluation of the gestation as well as the maternal uterus, adnexal regions, and pelvic cul-de-sac. Transvaginal technique was performed to assess early pregnancy. COMPARISON:  07/18/2017 and 06/30/2017 FINDINGS: Intrauterine gestational sac: None Yolk sac:  Not visualize Embryo:  Not visualized Maternal uterus/adnexae: Subchorionic hemorrhage: Not applicable Right ovary: Contains a corpus luteal cyst measuring 1.9 x 1.2 x 1.3 cm Left ovary: Normal Other :There is a mass in the right adnexa measuring 4.4 x 3.0 x 5.0 cm (volume = 35 cm^3). Previously 2.4 x 2.0 x 2.1 cm (volume = 5.3 cm^3). This contains an internal fluid collection measuring 8 mm which corresponds with a 5 week 4 day gestation. Free fluid:  Small amount of free fluid noted. IMPRESSION: 1. There is a persistent mass in the right adnexa with what appears to represent a small gestational sac. This has increased in volume when compared with 07/18/2017. Cannot rule out persistent ectopic pregnancy. 2. No intrauterine gestation identified. 3. Small volume of free fluid within the pelvis. Critical Value/emergent results were called by telephone at the time of interpretation on 07/26/2017 at 1:42 pm to Dr. Duke SalviaANDOLPH  BROWN , who verbally acknowledged these results. Electronically Signed   By: Signa Kell M.D.   On: 07/26/2017 13:42    ____________________________________________   PROCEDURES  Critical Care performed: CRITICAL CARE Performed by: Darci Current   Total critical care time: 30 minutes  Critical care time was exclusive of separately billable procedures and treating other patients.  Critical care was necessary to treat or prevent imminent or life-threatening deterioration.  Critical care was time spent personally by me on the following activities: development of treatment plan with patient and/or surrogate as well as nursing, discussions with consultants,  evaluation of patient's response to treatment, examination of patient, obtaining history from patient or surrogate, ordering and performing treatments and interventions, ordering and review of laboratory studies, ordering and review of radiographic studies, pulse oximetry and re-evaluation of patient's condition.   Procedures   ____________________________________________   INITIAL IMPRESSION / ASSESSMENT AND PLAN / ED COURSE  As part of my medical decision making, I reviewed the following data within the electronic MEDICAL RECORD NUMBER1 year old female presented with above-stated history physical exam concerning for ectopic pregnancy.  Ultrasound confirmed the presence of persistent right ectopic pregnancy with increase in size and as such patient discussed with Dr. Valentino Saxon   OB/GYN on-call who evaluated patient in the emergency department.   Patient is requesting Repeat methotrexate dose. .. Patient fully understand the potential risk of tubal rupture surgical option was also presented to the patient but again she is requesting methotrexate at this time.  Patient is advised to follow-up with Dr. Logan Bores within 48 hours. Patient is advised to return to the emergency department immediately pain was to worsen or any other emergency medical concerns.  ____________________________________________  FINAL CLINICAL IMPRESSION(S) / ED DIAGNOSES  Final diagnoses:  Right tubal pregnancy without intrauterine pregnancy     MEDICATIONS GIVEN DURING THIS VISIT:  Medications  methotrexate (50 mg/ml) chemo injection 85 mg (not administered)  morphine 2 MG/ML injection 2 mg (2 mg Intravenous Given 07/26/17 1015)  ondansetron (ZOFRAN) injection 4 mg (4 mg Intravenous Given 07/26/17 1015)  morphine 2 MG/ML injection 2 mg (2 mg Intravenous Given 07/26/17 1346)  sodium chloride 0.9 % bolus 1,000 mL (1,000 mLs Intravenous New Bag/Given 07/26/17 1346)     ED Discharge Orders    None       Note:  This  document was prepared using Dragon voice recognition software and may include unintentional dictation errors.    Darci Current, MD 07/26/17 [phone]

## 2017-07-26 NOTE — ED Triage Notes (Signed)
Pt states was seen here 1 week ago for same, pt states was treated for ectopic pregnancy that was treated with methotrexate. Pt c/o R sided lower abdominal pain that radiates up her back on the R side, pt also states pain radiates down her R leg to her knee. Pt states 2 episodes of vomiting, denies diarrhea, denies nausea due to Zofran she has been taking.

## 2017-07-26 NOTE — Consult Note (Signed)
Reason for Consult: Ectopic pregnancy Referring Physician: Dr. Owens Shark (ER Physician)  Teresa Bishop is an 21 y.o. G3P1001 female with a current right ectopic pregnancy s/p 1 dose of Methotrexate on 06/30/2017 at time of diagnosis who presented to the Emergency Room due to pelvic cramping.  She was then seen in the office ([facility] on 07/09/17 for follow up and hormone levels were noted to be declining appropriately). Patient at time of presentation noted pain of 8/10, however currently noting pain is mild.  Since last ER visit on 07/18/17 due to increased pain, patient notes that the pain did increase somewhat, but had been dealing with it (was not taking anything for the pain) for the past week.  Notes that vaginal bleeding has resolved, but pain has still been present. Denies fevers, chills, nausea/vomiting.   Pertinent Gynecological History: Last menstrual period was 05/12/2017.  Contraception: none Blood transfusions: none Sexually transmitted diseases: no past history Previous GYN Procedures: none  OB History: G2, P1001     History reviewed. No pertinent past medical history.  History reviewed. No pertinent surgical history.  Family History  Problem Relation Age of Onset  . Hypertension Neg Hx   . Cancer Neg Hx   . Diabetes Neg Hx     Social History:  reports that  has never smoked. she has never used smokeless tobacco. She reports that she does not drink alcohol or use drugs.  Allergies:  Allergies  Allergen Reactions  . Codeine     Medications: Prior to Admission:  No current facility-administered medications on file prior to encounter.    Current Outpatient Medications on File Prior to Encounter  Medication Sig Dispense Refill  . ondansetron (ZOFRAN) 4 MG tablet Take 1 tablet (4 mg total) by mouth daily as needed for nausea or vomiting. (Patient not taking: Reported on 07/26/2017) 20 tablet 1    Review of Systems  Constitutional: Negative for chills,  fever and malaise/fatigue.  HENT: Negative.   Eyes: Negative.   Respiratory: Negative for shortness of breath and wheezing.   Cardiovascular: Negative for palpitations.  Gastrointestinal: Positive for abdominal pain. Negative for constipation, diarrhea, nausea and vomiting.  Genitourinary: Negative for dysuria, frequency and urgency.  Musculoskeletal: Positive for back pain. Negative for myalgias.  Skin: Negative for itching and rash.  Neurological: Negative for dizziness, loss of consciousness and headaches.  Endo/Heme/Allergies: Negative.   Psychiatric/Behavioral: Negative.     Blood pressure 107/61, pulse 73, temperature 98.5 F (36.9 C), temperature source Oral, resp. rate 17, height 5' 7" (1.702 m), weight 137 lb (62.1 kg), SpO2 100 %. Physical Exam  Constitutional: She is oriented to person, place, and time. She appears well-developed and well-nourished. No distress.  HENT:  Head: Normocephalic and atraumatic.  Cardiovascular: Normal rate and regular rhythm. Exam reveals no gallop and no friction rub.  No murmur heard. Respiratory: Effort normal and breath sounds normal. No respiratory distress. She has no wheezes.  GI: Soft. Bowel sounds are normal. She exhibits no distension and no mass. There is tenderness. There is no rebound.  Mild tenderness in RLQ to palpation.   Genitourinary: Vagina normal and uterus normal. No vaginal discharge found.  Musculoskeletal: Normal range of motion. She exhibits no edema, tenderness or deformity.  Neurological: She is alert and oriented to person, place, and time.  Skin: Skin is warm and dry. No rash noted. She is not diaphoretic. No erythema.  Psychiatric: She has a normal mood and affect. Her behavior is normal. Thought content  normal.       Labs:  Results for Teresa, Bishop (MRN [id number]) as of 07/26/2017 14:23  Ref. Range 06/30/2017 17:40 07/04/2017 10:32 07/07/2017 12:26 07/18/2017 06:10 07/26/2017 10:02  HCG, Beta Chain, Quant, S  Latest Ref Range: <5 mIU/mL 13,363 (H) 16,832 (H)  5,732 (H) 3,145 (H)  hCG Quant Latest Units: mIU/mL   13,526     Results for orders placed or performed during the hospital encounter of 07/26/17 (from the past 48 hour(s))  hCG, quantitative, pregnancy     Status: Abnormal   Collection Time: 07/26/17 10:02 AM  Result Value Ref Range   hCG, Beta Chain, Quant, S 3,145 (H) <5 mIU/mL    Comment:          GEST. AGE      CONC.  (mIU/mL)   <=1 WEEK        5 - 50     2 WEEKS       50 - 500     3 WEEKS       100 - 10,000     4 WEEKS     1,000 - 30,000     5 WEEKS     3,500 - 115,000   6-8 WEEKS     12,000 - 270,000    12 WEEKS     15,000 - 220,000        FEMALE AND NON-PREGNANT FEMALE:     LESS THAN 5 mIU/mL   CBC     Status: Abnormal   Collection Time: 07/26/17 10:02 AM  Result Value Ref Range   WBC 5.6 3.6 - 11.0 K/uL   RBC 3.80 3.80 - 5.20 MIL/uL   Hemoglobin 10.5 (L) 12.0 - 16.0 g/dL   HCT 31.8 (L) 35.0 - 47.0 %   MCV 83.8 80.0 - 100.0 fL   MCH 27.7 26.0 - 34.0 pg   MCHC 33.0 32.0 - 36.0 g/dL   RDW 14.0 11.5 - 14.5 %   Platelets 270 150 - 440 K/uL  Comprehensive metabolic panel     Status: Abnormal   Collection Time: 07/26/17 10:02 AM  Result Value Ref Range   Sodium 140 135 - 145 mmol/L   Potassium 4.1 3.5 - 5.1 mmol/L   Chloride 106 101 - 111 mmol/L   CO2 26 22 - 32 mmol/L   Glucose, Bld 75 65 - 99 mg/dL   BUN 12 6 - 20 mg/dL   Creatinine, Ser 0.63 0.44 - 1.00 mg/dL   Calcium 9.1 8.9 - 10.3 mg/dL   Total Protein 6.8 6.5 - 8.1 g/dL   Albumin 3.6 3.5 - 5.0 g/dL   AST 20 15 - 41 U/L   ALT 12 (L) 14 - 54 U/L   Alkaline Phosphatase 42 38 - 126 U/L   Total Bilirubin 0.2 (L) 0.3 - 1.2 mg/dL   GFR calc non Af Amer >60 >60 mL/min   GFR calc Af Amer >60 >60 mL/min    Comment: (NOTE) The eGFR has been calculated using the CKD EPI equation. This calculation has not been validated in all clinical situations. eGFR's persistently <60 mL/min signify possible Chronic  Kidney Disease.    Anion gap 8 5 - 15    US Ob Comp Less 14 Wks  Result Date: 07/26/2017 CLINICAL DATA:  Persistent pelvic pain. Recent diagnosis of right ectopic. EXAM: OBSTETRIC <14 WK [place] AND TRANSVAGINAL OB US TECHNIQUE: Both transabdominal and transvaginal ultrasound examinations were performed for complete evaluation of the gestation as well  as the maternal uterus, adnexal regions, and pelvic cul-de-sac. Transvaginal technique was performed to assess early pregnancy. COMPARISON:  07/18/2017 and 06/30/2017 FINDINGS: Intrauterine gestational sac: None Yolk sac:  Not visualize Embryo:  Not visualized Maternal uterus/adnexae: Subchorionic hemorrhage: Not applicable Right ovary: Contains a corpus luteal cyst measuring 1.9 x 1.2 x 1.3 cm Left ovary: Normal Other :There is a mass in the right adnexa measuring 4.4 x 3.0 x 5.0 cm (volume = 35 cm^3). Previously 2.4 x 2.0 x 2.1 cm (volume = 5.3 cm^3). This contains an internal fluid collection measuring 8 mm which corresponds with a 5 week 4 day gestation. Free fluid:  Small amount of free fluid noted. IMPRESSION: 1. There is a persistent mass in the right adnexa with what appears to represent a small gestational sac. This has increased in volume when compared with 07/18/2017. Cannot rule out persistent ectopic pregnancy. 2. No intrauterine gestation identified. 3. Small volume of free fluid within the pelvis. Critical Value/emergent results were called by telephone at the time of interpretation on 07/26/2017 at 1:42 pm to Dr. Marjean Donna , who verbally acknowledged these results. Electronically Signed   By: Kerby Moors M.D.   On: 07/26/2017 13:42   US Ob Transvaginal  Result Date: 07/26/2017 CLINICAL DATA:  Persistent pelvic pain. Recent diagnosis of right ectopic. EXAM: OBSTETRIC <14 WK [place] AND TRANSVAGINAL OB US TECHNIQUE: Both transabdominal and transvaginal ultrasound examinations were performed for complete evaluation of the gestation as well as  the maternal uterus, adnexal regions, and pelvic cul-de-sac. Transvaginal technique was performed to assess early pregnancy. COMPARISON:  07/18/2017 and 06/30/2017 FINDINGS: Intrauterine gestational sac: None Yolk sac:  Not visualize Embryo:  Not visualized Maternal uterus/adnexae: Subchorionic hemorrhage: Not applicable Right ovary: Contains a corpus luteal cyst measuring 1.9 x 1.2 x 1.3 cm Left ovary: Normal Other :There is a mass in the right adnexa measuring 4.4 x 3.0 x 5.0 cm (volume = 35 cm^3). Previously 2.4 x 2.0 x 2.1 cm (volume = 5.3 cm^3). This contains an internal fluid collection measuring 8 mm which corresponds with a 5 week 4 day gestation. Free fluid:  Small amount of free fluid noted. IMPRESSION: 1. There is a persistent mass in the right adnexa with what appears to represent a small gestational sac. This has increased in volume when compared with 07/18/2017. Cannot rule out persistent ectopic pregnancy. 2. No intrauterine gestation identified. 3. Small volume of free fluid within the pelvis. Critical Value/emergent results were called by telephone at the time of interpretation on 07/26/2017 at 1:42 pm to Dr. Marjean Donna , who verbally acknowledged these results. Electronically Signed   By: Kerby Moors M.D.   On: 07/26/2017 13:42    Assessment/Plan: 1. Ectopic pregnancy (right sided)- patient is currently s/p 1 dose of Methotrexate therapy, given 06/30/2017.  Patient initially presented to the ER with increased pain, however at time of my exam, patient notes that the pain was still tolerable without requiring medications at home.  BHCG has been trending down appropriately, however seems to be slowing rate.  Discussed options for treatment, due to patient's increase in pain level and slowly trending HCG levels.  Options include repeating dose of Methotrexate (less optimal), vs surgical intervention with laparoscopic salpingostomy/salpingectomy. Discussed risks and benefits of receiving  second dose of medication, including failure of second dose of Methotrexate or tubal rupture, with hemorrhage and requiring surgery. Discussed risk of surgery, including infection, bleeding, risk of loss of entire fallopian tube (and possible ovary), as well  as recovery time.  Patient notes that she just started a new job recently and would like to try the repeat dose of methotrexate as she is not yet able to take time off.  Strongly stressed the importance of f/u on Day 4 in office for visit and bloodwork, and Day 7 for labs.  Given strict pain precautions.  ER physician to prescribe something for pain, advised on limited activity (pelvic rest and limited physical activity), and strongly encouraged patient to f/u immediately if pain is worse than what medication can treat.    Rubie Maid, MD [facility] 07/26/2017

## 2017-07-26 NOTE — ED Notes (Addendum)
Pt is here pain in the left lower pelvic region. Pt was seen here for ectopic pregnancy 06/30/17. Pt has not seen OBGYN since. Pt is NAD with family at bedside.  Pt refuses IV at this time.

## 2017-07-29 ENCOUNTER — Ambulatory Visit (INDEPENDENT_AMBULATORY_CARE_PROVIDER_SITE_OTHER): Payer: Medicaid - Out of State | Admitting: Obstetrics and Gynecology

## 2017-07-29 ENCOUNTER — Encounter: Payer: Self-pay | Admitting: Emergency Medicine

## 2017-07-29 ENCOUNTER — Encounter: Payer: Self-pay | Admitting: Obstetrics and Gynecology

## 2017-07-29 ENCOUNTER — Emergency Department: Payer: Self-pay

## 2017-07-29 ENCOUNTER — Emergency Department
Admission: EM | Admit: 2017-07-29 | Discharge: 2017-07-29 | Disposition: A | Discharge status: Home / Self Care | Payer: Self-pay | Attending: Emergency Medicine | Admitting: Emergency Medicine

## 2017-07-29 VITALS — BP 103/65 | HR 73 | Wt 131.2 lb

## 2017-07-29 DIAGNOSIS — O00109 Unspecified tubal pregnancy without intrauterine pregnancy: Secondary | ICD-10-CM

## 2017-07-29 DIAGNOSIS — O008 Other ectopic pregnancy without intrauterine pregnancy: Secondary | ICD-10-CM | POA: Insufficient documentation

## 2017-07-29 LAB — URINALYSIS, COMPLETE (UACMP) WITH MICROSCOPIC
BILIRUBIN URINE: NEGATIVE
GLUCOSE, UA: NEGATIVE mg/dL
KETONES UR: 20 mg/dL — AB
Leukocytes, UA: NEGATIVE
NITRITE: NEGATIVE
PROTEIN: 30 mg/dL — AB
Specific Gravity, Urine: 1.02 (ref 1.005–1.030)
pH: 6 (ref 5.0–8.0)

## 2017-07-29 LAB — COMPREHENSIVE METABOLIC PANEL
ALK PHOS: 43 U/L (ref 38–126)
ALT: 15 U/L (ref 14–54)
ANION GAP: 9 (ref 5–15)
AST: 20 U/L (ref 15–41)
Albumin: 3.9 g/dL (ref 3.5–5.0)
BUN: 10 mg/dL (ref 6–20)
CALCIUM: 9.1 mg/dL (ref 8.9–10.3)
CHLORIDE: 105 mmol/L (ref 101–111)
CO2: 24 mmol/L (ref 22–32)
Creatinine, Ser: 0.7 mg/dL (ref 0.44–1.00)
GFR calc non Af Amer: >60 mL/min (ref >60)
Glucose, Bld: 89 mg/dL (ref 65–99)
Potassium: 3.7 mmol/L (ref 3.5–5.1)
SODIUM: 138 mmol/L (ref 135–145)
Total Bilirubin: 0.4 mg/dL (ref 0.3–1.2)
Total Protein: 7.3 g/dL (ref 6.5–8.1)

## 2017-07-29 LAB — POCT PREGNANCY, URINE: Preg Test, Ur: POSITIVE — AB

## 2017-07-29 LAB — CBC
HCT: 33.1 % — ABNORMAL LOW (ref 35.0–47.0)
HEMOGLOBIN: 11 g/dL — AB (ref 12.0–16.0)
MCH: 27.9 pg (ref 26.0–34.0)
MCHC: 33.4 g/dL (ref 32.0–36.0)
MCV: 83.5 fL (ref 80.0–100.0)
Platelets: 305 10*3/uL (ref 150–440)
RBC: 3.96 MIL/uL (ref 3.80–5.20)
RDW: 14 % (ref 11.5–14.5)
WBC: 5.8 10*3/uL (ref 3.6–11.0)

## 2017-07-29 LAB — HCG, QUANTITATIVE, PREGNANCY: hCG, Beta Chain, Quant, S: 1870 m[IU]/mL — ABNORMAL HIGH (ref <5)

## 2017-07-29 LAB — LIPASE, BLOOD: LIPASE: 24 U/L (ref 11–51)

## 2017-07-29 MED ORDER — ONDANSETRON 4 MG PO TBDP: 4.0000 mg | ORAL_TABLET | Freq: Three times a day (TID) | ORAL | 0 refills | Status: DC | PRN

## 2017-07-29 MED ORDER — ONDANSETRON 4 MG PO TBDP
4.0000 mg | ORAL_TABLET | Freq: Once | ORAL | Status: AC
  Administered 2017-07-29: 4 mg via ORAL
  Filled 2017-07-29: qty 1

## 2017-07-29 MED ORDER — HYDROCODONE-ACETAMINOPHEN 5-325 MG PO TABS
2.0000 | ORAL_TABLET | Freq: Once | ORAL | Status: AC
  Administered 2017-07-29: 2 via ORAL
  Filled 2017-07-29: qty 2

## 2017-07-29 MED ORDER — HYDROCODONE-ACETAMINOPHEN 5-325 MG PO TABS: 1.0000 | ORAL_TABLET | Freq: Four times a day (QID) | ORAL | 0 refills | Status: DC | PRN

## 2017-07-29 NOTE — ED Notes (Signed)
NAD noted at time of D/C. Pt denies questions or concerns. Pt ambulatory to the lobby at this time.  

## 2017-07-29 NOTE — ED Notes (Addendum)
FIRST NURSE NOTE: Pt states she was here on Sunday and was given a second dose of Methotrexate, states she continues to have pain on the right side. S/p ectopic pregnancy. Pt ambulatory in lobby.

## 2017-07-29 NOTE — ED Notes (Signed)
Pt taken to US at this time

## 2017-07-29 NOTE — ED Triage Notes (Signed)
Patient presents to ED via POV from home with c/o right sided abdominal pain and "pressure" when she voids x 2 weeks. Patient also reports N/V.

## 2017-07-29 NOTE — ED Notes (Signed)
Pt returned from US

## 2017-07-29 NOTE — ED Provider Notes (Addendum)
[facility] Emergency Department Provider Note       Time seen: ----------------------------------------- 8:24 AM on 07/29/2017 -----------------------------------------   I have reviewed the triage vital signs and the nursing notes.  HISTORY   Chief Complaint Abdominal Pain    HPI Teresa Bishop is a 21 y.o. female with a history of recent ectopic pregnancy and methotrexate treatment x2 who presents to the ED for persistent right-sided abdominal pain and pressure.  This is been present when she voids for the past 2 weeks.  Patient states the pain was so bad yesterday she had a hard time walking.  She has been taking Tylenol with mild improvement in her pain.  Pain is moderate at this time in the right side of her pelvis  History reviewed. No pertinent past medical history.  There are no active problems to display for this patient.   History reviewed. No pertinent surgical history.  Allergies Codeine  Social History Social History   Tobacco Use  . Smoking status: Never Smoker  . Smokeless tobacco: Never Used  Substance Use Topics  . Alcohol use: No    Frequency: Never  . Drug use: No    Review of Systems Constitutional: Negative for fever. Cardiovascular: Negative for chest pain. Respiratory: Negative for shortness of breath. Gastrointestinal: Negative for abdominal pain, vomiting and diarrhea. Genitourinary: Positive for pelvic pain, dysuria Musculoskeletal: Negative for back pain. Skin: Negative for rash. Neurological: Negative for headaches, focal weakness or numbness.  All systems negative/normal/unremarkable except as stated in the HPI  ____________________________________________   PHYSICAL EXAM:  VITAL SIGNS: ED Triage Vitals  Enc Vitals Group     BP 07/29/17 0813 126/75     Pulse Rate 07/29/17 0813 90     Resp 07/29/17 0813 15     Temp 07/29/17 0813 97.9 F (36.6 C)     Temp Source 07/29/17 0813 Oral     SpO2 07/29/17  0813 100 %     Weight 07/29/17 0814 137 lb (62.1 kg)     Height 07/29/17 0814 5\' 7"  (1.702 m)     Head Circumference --      Peak Flow --      Pain Score --      Pain Loc --      Pain Edu? --      Excl. in GC? --     Constitutional: Alert and oriented. Well appearing and in no distress. Eyes: Conjunctivae are normal. Normal extraocular movements. Cardiovascular: Normal rate, regular rhythm. No murmurs, rubs, or gallops. Respiratory: Normal respiratory effort without tachypnea nor retractions. Breath sounds are clear and equal bilaterally. No wheezes/rales/rhonchi. Gastrointestinal: Soft with mild right hemipelvic tenderness, no rebound or guarding.  Normal bowel sounds. Musculoskeletal: Nontender with normal range of motion in extremities. No lower extremity tenderness nor edema. Neurologic:  Normal speech and language. No gross focal neurologic deficits are appreciated.  Skin:  Skin is warm, dry and intact. No rash noted. Psychiatric: Flat affect ____________________________________________  ED COURSE:  As part of my medical decision making, I reviewed the following data within the electronic MEDICAL RECORD NUMBER History obtained from family if available, nursing notes, old chart and ekg, as well as notes from prior ED visits. Patient presented for persistent abdominal pain, we will assess with labs and imaging as indicated at this time. Clinical Course as of Jul 29 1032  Wed Jul 29, 2017  1009 HCG, Beta Chain, Quant, S: (!) 1,870 [JW]  1025 IMPRESSION: No intrauterine gestational sac  is noted. Persistent right adnexal complex mass is noted which is not significantly changed in size compared to prior exam. Persistent cystic center is noted which may represent gestational sac. These findings are most consistent with persistent ectopic pregnancy. Consultation with obstetrical surgery is recommended. Critical Value/emergent results were called by telephone at the time of interpretation  on 07/29/2017 at 10:19 am to Dr. Daryel NovemberJONATHAN Garland [place] , who verbally acknowledged these results.     [JW]    Clinical Course User Index [JW] Emily FilbertWilliams, Adriano Bischof E, MD   Procedures ____________________________________________   LABS (pertinent positives/negatives)  Labs Reviewed  CBC - Abnormal; Notable for the following components:      Result Value   Hemoglobin 11.0 (*)    HCT 33.1 (*)    All other components within normal limits  URINALYSIS, COMPLETE (UACMP) WITH MICROSCOPIC - Abnormal; Notable for the following components:   Color, Urine YELLOW (*)    APPearance HAZY (*)    Hgb urine dipstick LARGE (*)    Ketones, ur 20 (*)    Protein, ur 30 (*)    Bacteria, UA RARE (*)    Squamous Epithelial / LPF 6-30 (*)    All other components within normal limits  HCG, QUANTITATIVE, PREGNANCY - Abnormal; Notable for the following components:   hCG, Beta Chain, Quant, S 1,870 (*)    All other components within normal limits  POCT PREGNANCY, URINE - Abnormal; Notable for the following components:   Preg Test, Ur POSITIVE (*)    All other components within normal limits  LIPASE, BLOOD  COMPREHENSIVE METABOLIC PANEL  POC URINE PREG, ED    RADIOLOGY Images were viewed by me  Pelvic ultrasound Revealed persistent right-sided ectopic pregnancy with no significant change in size ____________________________________________  DIFFERENTIAL DIAGNOSIS   Ectopic pregnancy, ruptured ectopic, hemoperitoneum, resolving ectopic UTI, PID  FINAL ASSESSMENT AND PLAN  Ectopic pregnancy   Plan: Patient had presented for persistent abdominal pain and dysuria despite 2 rounds of methotrexate treatment. Patient's labs did reveal a decreasing hCG level. Patient's imaging revealed persistent right-sided ectopic pregnancy.  I will discuss with GYN about possible surgery.  Patient was discussed with GYN on call Dr.Defrancesco who states that they will see her in the office in the next 24 hours.  I  will prescribe pain medicine for her as well as antiemetics.  Patient is agreeable to plan. Emily FilbertWilliams, Aunica Dauphinee E, MD   Note: This note was generated in part or whole with voice recognition software. Voice recognition is usually quite accurate but there are transcription errors that can and very often do occur. I apologize for any typographical errors that were not detected and corrected.     Emily FilbertWilliams, Darden Flemister E, MD 07/29/17 1023    Emily FilbertWilliams, Susie Ehresman E, MD 07/29/17 [phone]

## 2017-07-29 NOTE — Progress Notes (Signed)
HPI:      [name] is a 21 y.o. G1P0 who LMP was No LMP recorded.  Subjective:   She presents today after being seen in the emergency department today and over the weekend.  She has been followed for an ectopic pregnancy.  This last weekend she received a second shot of methotrexate despite falling quantitative beta hCGs.  The patient was told by the ER doctor that the size of the ectopic was increasing so she became more and more concerned and presented again to the ED today. She reports her pain is minimal today.  She does say it was worse this last weekend.     Hx: The following portions of the patient's history were reviewed and updated as appropriate:             She  has no past medical history on file. She does not have a problem list on file. She  has no past surgical history on file. Her family history is not on file. She  reports that  has never smoked. she has never used smokeless tobacco. She reports that she does not drink alcohol or use drugs. She is allergic to codeine.       Review of Systems:  Review of Systems  Constitutional: Denied constitutional symptoms, night sweats, recent illness, fatigue, fever, insomnia and weight loss.  Eyes: Denied eye symptoms, eye pain, photophobia, vision change and visual disturbance.  Ears/Nose/Throat/Neck: Denied ear, nose, throat or neck symptoms, hearing loss, nasal discharge, sinus congestion and sore throat.  Cardiovascular: Denied cardiovascular symptoms, arrhythmia, chest pain/pressure, edema, exercise intolerance, orthopnea and palpitations.  Respiratory: Denied pulmonary symptoms, asthma, pleuritic pain, productive sputum, cough, dyspnea and wheezing.  Gastrointestinal: Denied, gastro-esophageal reflux, melena, nausea and vomiting.  Genitourinary: Denied genitourinary symptoms including symptomatic vaginal discharge, pelvic relaxation issues, and urinary complaints.  Musculoskeletal: Denied musculoskeletal symptoms,  stiffness, swelling, muscle weakness and myalgia.  Dermatologic: Denied dermatology symptoms, rash and scar.  Neurologic: Denied neurology symptoms, dizziness, headache, neck pain and syncope.  Psychiatric: Denied psychiatric symptoms, anxiety and depression.  Endocrine: Denied endocrine symptoms including hot flashes and night sweats.   Meds:   No current outpatient medications on file prior to visit.   No current facility-administered medications on file prior to visit.     Objective:     Vitals:   07/29/17 1510  BP: 103/65  Pulse: 73              Quantitative beta hCGs continue to follow appropriately after use of methotrexate for ectopic pregnancy.  Assessment:    G1P0 There are no active problems to display for this patient.    1. Tubal pregnancy without intrauterine pregnancy, unspecified laterality     Patient having minimal abdominal pain.  She is satisfied with the medical management going forward.   Plan:            1.  We will continue weekly quantitative beta hCGs  2.  If beta hCGs rise recommend surgical excision  3.  If patient's pain becomes worse strongly consider surgical excision. Orders No orders of the defined types were placed in this encounter.   No orders of the defined types were placed in this encounter.     F/U  Return in about 1 week (around 08/05/2017). I spent 16 minutes with this patient of which greater than 50% was spent discussing ectopic pregnancy, use of methotrexate, necessity of quantitative beta hCGs.  Elonda Huskyavid J. Evans, M.D. 07/29/2017 3:46  PM

## 2017-08-12 ENCOUNTER — Telehealth: Payer: Self-pay

## 2017-08-12 ENCOUNTER — Other Ambulatory Visit: Payer: Self-pay

## 2017-08-12 ENCOUNTER — Telehealth: Payer: Self-pay | Admitting: Obstetrics and Gynecology

## 2017-08-12 NOTE — Telephone Encounter (Signed)
The patient called in regards to her not being able to have her blood drawn in [place] [facility], The patient did not disclose any other information. Please advise.

## 2017-08-12 NOTE — Addendum Note (Signed)
Addended by: Brooke DareSICK, Teyonna Plaisted L on: 08/12/2017 04:01 PM   Modules accepted: Orders

## 2017-08-12 NOTE — Telephone Encounter (Signed)
Attempted to contact pt at number listed. Phone does not ring- dead air. If pt calls back the orders have been in the system since 06/29/17 placed by  [facility] tech. VT.

## 2017-08-12 NOTE — Telephone Encounter (Signed)
Message left on pts voicemail- the standing order was in the system but tech was not able to access. Hard copy faxed by VT.

## 2017-09-02 ENCOUNTER — Other Ambulatory Visit: Payer: BLUE CROSS/BLUE SHIELD

## 2017-09-03 LAB — BETA HCG QUANT (REF LAB): hCG Quant: <1 m[IU]/mL

## 2017-09-09 ENCOUNTER — Encounter: Payer: BLUE CROSS/BLUE SHIELD | Admitting: Obstetrics and Gynecology

## 2017-09-16 ENCOUNTER — Ambulatory Visit: Payer: BLUE CROSS/BLUE SHIELD | Admitting: Obstetrics and Gynecology

## 2017-09-16 ENCOUNTER — Encounter: Payer: Self-pay | Admitting: Obstetrics and Gynecology

## 2017-09-16 VITALS — BP 113/77 | HR 102 | Ht 67.0 in | Wt 135.4 lb

## 2017-09-16 DIAGNOSIS — Z30013 Encounter for initial prescription of injectable contraceptive: Secondary | ICD-10-CM | POA: Diagnosis not present

## 2017-09-16 DIAGNOSIS — Z3009 Encounter for other general counseling and advice on contraception: Secondary | ICD-10-CM | POA: Diagnosis not present

## 2017-09-16 MED ORDER — MEDROXYPROGESTERONE ACETATE 150 MG/ML IM SUSP
150.0000 mg | Freq: Once | INTRAMUSCULAR | Status: AC
  Administered 2017-09-16: 150 mg via INTRAMUSCULAR

## 2017-09-16 MED ORDER — MEDROXYPROGESTERONE ACETATE 150 MG/ML IM SUSP: 150.0000 mg | INTRAMUSCULAR | 1 refills | Status: DC

## 2017-09-16 NOTE — Progress Notes (Signed)
HPI:      [name] is a 22 y.o. G1P0 who LMP was Patient's last menstrual period was 08/27/2017.  Subjective:   She presents today for discussion of birth control.  Patient has used Depo-Provera before and is interested in restarting this.      Hx: The following portions of the patient's history were reviewed and updated as appropriate:             She  has no past medical history on file. She does not have a problem list on file. She  has no past surgical history on file. Her family history is not on file. She  reports that  has never smoked. she has never used smokeless tobacco. She reports that she does not drink alcohol or use drugs. She has a current medication list which includes the following prescription(s): medroxyprogesterone. She is allergic to codeine.       Review of Systems:  Review of Systems  Constitutional: Denied constitutional symptoms, night sweats, recent illness, fatigue, fever, insomnia and weight loss.  Eyes: Denied eye symptoms, eye pain, photophobia, vision change and visual disturbance.  Ears/Nose/Throat/Neck: Denied ear, nose, throat or neck symptoms, hearing loss, nasal discharge, sinus congestion and sore throat.  Cardiovascular: Denied cardiovascular symptoms, arrhythmia, chest pain/pressure, edema, exercise intolerance, orthopnea and palpitations.  Respiratory: Denied pulmonary symptoms, asthma, pleuritic pain, productive sputum, cough, dyspnea and wheezing.  Gastrointestinal: Denied, gastro-esophageal reflux, melena, nausea and vomiting.  Genitourinary: Denied genitourinary symptoms including symptomatic vaginal discharge, pelvic relaxation issues, and urinary complaints.  Musculoskeletal: Denied musculoskeletal symptoms, stiffness, swelling, muscle weakness and myalgia.  Dermatologic: Denied dermatology symptoms, rash and scar.  Neurologic: Denied neurology symptoms, dizziness, headache, neck pain and syncope.  Psychiatric: Denied psychiatric  symptoms, anxiety and depression.  Endocrine: Denied endocrine symptoms including hot flashes and night sweats.   Meds:   No current outpatient medications on file prior to visit.   No current facility-administered medications on file prior to visit.     Objective:     Vitals:   09/16/17 0834  BP: 113/77  Pulse: (!) 102                Assessment:    G1P0 There are no active problems to display for this patient.    1. Birth control counseling   2. Initiation of Depo Provera        Plan:            1.  Birth Control I discussed multiple birth control options and methods with the patient.  The risks and benefits of each were reviewed. Discussed Depo-Provera in detail.  Bone loss irregular bleeding,, weight gain, additional side effects discussed in detail. Patient has confirmed her desire for Depo-Provera.  She has used it before and has worked well for her.   Orders No orders of the defined types were placed in this encounter.    Meds ordered this encounter  Medications  . medroxyPROGESTERone (DEPO-PROVERA) 150 MG/ML injection    Sig: Inject 1 mL (150 mg total) into the muscle every 3 (three) months.    Dispense:  1 mL    Refill:  1      F/U  Return in about 3 months (around 12/14/2017). I spent 16 minutes with this patient of which greater than 50% was spent discussing multiple birth control methods risks and benefits, use of Depo-Provera risks and benefits, resolution of ectopic pregnancy.  Annual examination.  Elonda Huskyavid J. Evans, M.D. 09/16/2017 9:02  AM    

## 2017-09-16 NOTE — Addendum Note (Signed)
Addended by: Rosine BeatLONTZ, Donnivan Villena L on: 09/16/2017 10:05 AM   Modules accepted: Orders

## 2017-12-02 ENCOUNTER — Encounter: Payer: BLUE CROSS/BLUE SHIELD | Admitting: Obstetrics and Gynecology

## 2017-12-08 ENCOUNTER — Ambulatory Visit: Payer: Self-pay | Admitting: Obstetrics and Gynecology

## 2017-12-08 ENCOUNTER — Encounter: Payer: Self-pay | Admitting: Obstetrics and Gynecology

## 2017-12-08 VITALS — BP 99/59 | HR 71 | Ht 67.0 in | Wt 131.0 lb

## 2017-12-08 DIAGNOSIS — Z0000 Encounter for general adult medical examination without abnormal findings: Secondary | ICD-10-CM

## 2017-12-08 DIAGNOSIS — Z3042 Encounter for surveillance of injectable contraceptive: Secondary | ICD-10-CM

## 2017-12-08 MED ORDER — MEDROXYPROGESTERONE ACETATE 150 MG/ML IM SUSP
150.0000 mg | Freq: Once | INTRAMUSCULAR | Status: AC
  Administered 2017-12-08: 150 mg via INTRAMUSCULAR

## 2017-12-08 MED ORDER — MEDROXYPROGESTERONE ACETATE 150 MG/ML IM SUSP: 150.0000 mg | INTRAMUSCULAR | 3 refills | Status: DC

## 2017-12-08 NOTE — Progress Notes (Signed)
Pt is doing well concerned about spotting between cycles since starting depo.

## 2017-12-08 NOTE — Progress Notes (Signed)
HPI:      [name] is a 22 y.o. G1P0 who LMP was Patient's last menstrual period was 11/23/2017.  Subjective:   She presents today for her annual examination.  She is on Depo-Provera for birth control.  She has had some spotting in her first cycle.  She has used Depo-Provera for her and she experienced some bleeding with previous use. Otherwise without complaint.    Hx: The following portions of the patient's history were reviewed and updated as appropriate:             She  has a past medical history of Heart murmur. She does not have a problem list on file. She  has no past surgical history on file. Her family history is not on file. She  reports that she has never smoked. She has never used smokeless tobacco. She reports that she does not drink alcohol or use drugs. She has a current medication list which includes the following prescription(s): medroxyprogesterone. She is allergic to codeine.       Review of Systems:  Review of Systems  Constitutional: Denied constitutional symptoms, night sweats, recent illness, fatigue, fever, insomnia and weight loss.  Eyes: Denied eye symptoms, eye pain, photophobia, vision change and visual disturbance.  Ears/Nose/Throat/Neck: Denied ear, nose, throat or neck symptoms, hearing loss, nasal discharge, sinus congestion and sore throat.  Cardiovascular: Denied cardiovascular symptoms, arrhythmia, chest pain/pressure, edema, exercise intolerance, orthopnea and palpitations.  Respiratory: Denied pulmonary symptoms, asthma, pleuritic pain, productive sputum, cough, dyspnea and wheezing.  Gastrointestinal: Denied, gastro-esophageal reflux, melena, nausea and vomiting.  Genitourinary: Denied genitourinary symptoms including symptomatic vaginal discharge, pelvic relaxation issues, and urinary complaints.  Musculoskeletal: Denied musculoskeletal symptoms, stiffness, swelling, muscle weakness and myalgia.  Dermatologic: Denied dermatology  symptoms, rash and scar.  Neurologic: Denied neurology symptoms, dizziness, headache, neck pain and syncope.  Psychiatric: Denied psychiatric symptoms, anxiety and depression.  Endocrine: Denied endocrine symptoms including hot flashes and night sweats.   Meds:   No current outpatient medications on file prior to visit.   No current facility-administered medications on file prior to visit.     Objective:     Vitals:   12/08/17 1357  BP: (!) 99/59  Pulse: 71              Physical examination General NAD, Conversant  HEENT Atraumatic; Op clear with mmm.  Normo-cephalic. Pupils reactive. Anicteric sclerae  Thyroid/Neck Smooth without nodularity or enlargement. Normal ROM.  Neck Supple.  Skin No rashes, lesions or ulceration. Normal palpated skin turgor. No nodularity.  Breasts: No masses or discharge.  Symmetric.  No axillary adenopathy.  Lungs: Clear to auscultation.No rales or wheezes. Normal Respiratory effort, no retractions.  Heart: NSR.  No murmurs or rubs appreciated. No periferal edema  Abdomen: Soft.  Non-tender.  No masses.  No HSM. No hernia  Extremities: Moves all appropriately.  Normal ROM for age. No lymphadenopathy.  Neuro: Oriented to PPT.  Normal mood. Normal affect.     Pelvic:   Vulva: Normal appearance.  No lesions.  Vagina: No lesions or abnormalities noted.  Support: Normal pelvic support.  Urethra No masses tenderness or scarring.  Meatus Normal size without lesions or prolapse.  Cervix: Normal appearance.  No lesions.  Anus: Normal exam.  No lesions.  Perineum: Normal exam.  No lesions.        Bimanual   Uterus: Normal size.  Non-tender.  Mobile.  AV.  Adnexae: No masses.  Non-tender to palpation.  Cul-de-sac: Negative for abnormality.      Assessment:    G1P0 There are no active problems to display for this patient.    1. Encounter for annual physical exam   2. Depo-Provera contraceptive status     Patient would like to continue  Depo.   Plan:            1.Basic Screening Recommendations The basic screening recommendations for asymptomatic women were discussed with the patient during her visit.  The age-appropriate recommendations were discussed with her and the rational for the tests reviewed.  When I am informed by the patient that another primary care physician has previously obtained the age-appropriate tests and they are up-to-date, only outstanding tests are ordered and referrals given as necessary.  Abnormal results of tests will be discussed with her when all of her results are completed. Pap-GC/chlamydia performed. Orders No orders of the defined types were placed in this encounter.    Meds ordered this encounter  Medications  . medroxyPROGESTERone (DEPO-PROVERA) 150 MG/ML injection    Sig: Inject 1 mL (150 mg total) into the muscle every 3 (three) months.    Dispense:  1 mL    Refill:  3        F/U  Return in about 3 months (around 03/09/2018).  Elonda Husky, M.D. 12/08/2017 2:36 PM

## 2017-12-08 NOTE — Addendum Note (Signed)
Addended by: Marchelle Folks on: 12/08/2017 04:31 PM   Modules accepted: Orders

## 2017-12-10 LAB — PAP IG, CT-NG, RFX HPV ASCU
Chlamydia, Nuc. Acid Amp: NEGATIVE
GONOCOCCUS BY NUCLEIC ACID AMP: NEGATIVE
PAP Smear Comment: 0

## 2018-02-23 ENCOUNTER — Ambulatory Visit: Payer: Self-pay

## 2018-12-10 ENCOUNTER — Encounter: Payer: Self-pay | Admitting: Obstetrics and Gynecology

## 2018-12-17 ENCOUNTER — Encounter: Payer: Self-pay | Admitting: Obstetrics and Gynecology

## 2019-06-21 ENCOUNTER — Encounter: Payer: Self-pay | Admitting: Obstetrics and Gynecology

## 2019-06-21 ENCOUNTER — Other Ambulatory Visit: Payer: Self-pay

## 2019-06-21 ENCOUNTER — Ambulatory Visit: Payer: Self-pay | Admitting: Obstetrics and Gynecology

## 2019-06-21 ENCOUNTER — Other Ambulatory Visit (HOSPITAL_COMMUNITY)
Admission: RE | Admit: 2019-06-21 | Discharge: 2019-06-21 | Disposition: A | Discharge status: Home / Self Care | Payer: Self-pay | Source: Ambulatory Visit | Attending: Obstetrics and Gynecology | Admitting: Obstetrics and Gynecology

## 2019-06-21 VITALS — BP 129/78 | HR 111 | Ht 67.0 in | Wt 149.9 lb

## 2019-06-21 DIAGNOSIS — Z01419 Encounter for gynecological examination (general) (routine) without abnormal findings: Secondary | ICD-10-CM | POA: Insufficient documentation

## 2019-06-21 NOTE — Progress Notes (Signed)
Patient comes in today for annual exam. Patient is using condoms for [facility]. She has had new sex partner since last PAP.

## 2019-06-21 NOTE — Addendum Note (Signed)
Addended by: Durwin Glaze on: 06/21/2019 03:39 PM   Modules accepted: Orders

## 2019-06-21 NOTE — Progress Notes (Signed)
HPI:      [name] is a 23 y.o. G1P0 who LMP was Patient's last menstrual period was 06/11/2019.  Subjective:   She presents today for her annual examination.  She is slightly behind in her annual examination but says that Covid has been a factor.  She does complain of 2 small bumps on her left labia that have been there for a while and do not seem to be changing.  They are nontender.  They do not come and go. She has stopped using Depo-Provera for birth control and is now using condoms.  She likes this method of birth control because she feels as if she has some control.    Hx: The following portions of the patient's history were reviewed and updated as appropriate:             She  has a past medical history of Heart murmur. She does not have a problem list on file. She  has no past surgical history on file. Her family history is not on file. She  reports that she has never smoked. She has never used smokeless tobacco. She reports that she does not drink alcohol or use drugs. She currently has no medications in their medication list. She is allergic to codeine.       Review of Systems:  Review of Systems  Constitutional: Denied constitutional symptoms, night sweats, recent illness, fatigue, fever, insomnia and weight loss.  Eyes: Denied eye symptoms, eye pain, photophobia, vision change and visual disturbance.  Ears/Nose/Throat/Neck: Denied ear, nose, throat or neck symptoms, hearing loss, nasal discharge, sinus congestion and sore throat.  Cardiovascular: Denied cardiovascular symptoms, arrhythmia, chest pain/pressure, edema, exercise intolerance, orthopnea and palpitations.  Respiratory: Denied pulmonary symptoms, asthma, pleuritic pain, productive sputum, cough, dyspnea and wheezing.  Gastrointestinal: Denied, gastro-esophageal reflux, melena, nausea and vomiting.  Genitourinary: See HPI for additional information.  Musculoskeletal: Denied musculoskeletal symptoms,  stiffness, swelling, muscle weakness and myalgia.  Dermatologic: Denied dermatology symptoms, rash and scar.  Neurologic: Denied neurology symptoms, dizziness, headache, neck pain and syncope.  Psychiatric: Denied psychiatric symptoms, anxiety and depression.  Endocrine: Denied endocrine symptoms including hot flashes and night sweats.   Meds:   No current outpatient medications on file prior to visit.   No current facility-administered medications on file prior to visit.     Objective:     Vitals:   06/21/19 1454  BP: 129/78  Pulse: (!) 111              Physical examination General NAD, Conversant  HEENT Atraumatic; Op clear with mmm.  Normo-cephalic. Pupils reactive. Anicteric sclerae  Thyroid/Neck Smooth without nodularity or enlargement. Normal ROM.  Neck Supple.  Skin No rashes, lesions or ulceration. Normal palpated skin turgor. No nodularity.  Breasts: No masses or discharge.  Symmetric.  No axillary adenopathy.  Lungs: Clear to auscultation.No rales or wheezes. Normal Respiratory effort, no retractions.  Heart: NSR.  No murmurs or rubs appreciated. No periferal edema  Abdomen: Soft.  Non-tender.  No masses.  No HSM. No hernia  Extremities: Moves all appropriately.  Normal ROM for age. No lymphadenopathy.  Neuro: Oriented to PPT.  Normal mood. Normal affect.     Pelvic:   Vulva:  Left labia 2 small subcutaneous bumps palpable.  Almost invisible.  Nontender.  Vagina: No lesions or abnormalities noted.  Support: Normal pelvic support.  Urethra No masses tenderness or scarring.  Meatus Normal size without lesions or prolapse.  Cervix: Normal appearance.  No lesions.  Anus: Normal exam.  No lesions.  Perineum: Normal exam.  No lesions.        Bimanual   Uterus: Normal size.  Non-tender.  Mobile.  AV.  Adnexae: No masses.  Non-tender to palpation.  Cul-de-sac: Negative for abnormality.      Assessment:    G1P0 There are no active problems to display for this  patient.    1. Well woman exam with routine gynecological exam     Vulvar bumps likely consistent with sebaceous cysts.  Certainly benign process.   Plan:            1.  Basic Screening Recommendations The basic screening recommendations for asymptomatic women were discussed with the patient during her visit.  The age-appropriate recommendations were discussed with her and the rational for the tests reviewed.  When I am informed by the patient that another primary care physician has previously obtained the age-appropriate tests and they are up-to-date, only outstanding tests are ordered and referrals given as necessary.  Abnormal results of tests will be discussed with her when all of her results are completed.  Routine preventative health maintenance measures emphasized: Exercise/Diet/Weight control, Tobacco Warnings, Alcohol/Substance use risks and Stress Management GC/CT performed as recommended 2.  Reassured patient regarding asymptomatic left labial cyst.  Sebaceous cyst discussed.  Continue expectant management. Orders No orders of the defined types were placed in this encounter.   No orders of the defined types were placed in this encounter.       F/U  Return in about 1 year (around 06/20/2020) for Annual Physical.  Elonda Husky, M.D. 06/21/2019 3:17 PM

## 2019-06-23 LAB — CERVICOVAGINAL ANCILLARY ONLY
Bacterial Vaginitis (gardnerella): POSITIVE — AB
Candida Glabrata: NEGATIVE
Candida Vaginitis: NEGATIVE
Chlamydia: NEGATIVE
Comment: NEGATIVE
Comment: NEGATIVE
Comment: NEGATIVE
Comment: NEGATIVE
Comment: NEGATIVE
Comment: NORMAL
Neisseria Gonorrhea: NEGATIVE
Trichomonas: NEGATIVE

## 2019-06-28 ENCOUNTER — Telehealth: Payer: Self-pay | Admitting: Obstetrics and Gynecology

## 2019-06-28 NOTE — Telephone Encounter (Signed)
LM for patient to return call.

## 2019-06-28 NOTE — Telephone Encounter (Signed)
The patient called and stated that she just got her results today and is wanting to speak with a nurse if possible in regards to them. Please advise.

## 2019-06-29 MED ORDER — METRONIDAZOLE 500 MG PO TABS: 500.0000 mg | ORAL_TABLET | Freq: Two times a day (BID) | ORAL | 0 refills | Status: DC

## 2019-06-29 NOTE — Telephone Encounter (Signed)
Notified patient of lab results and sent medication to the pharmacy.

## 2019-07-21 IMAGING — US US OB TRANSVAGINAL
1 series · 13 of 28 positions shown · non-contrast
Comparison: None.

CLINICAL DATA: 21-year-old pregnant female with right lower
quadrant pain and vaginal bleeding. LMP: 05/12/2017 corresponding to
an estimated gestational age of 7 weeks, 0 days.

EXAM:
OBSTETRIC <14 WK US AND TRANSVAGINAL OB US
TECHNIQUE: Both transabdominal and transvaginal ultrasound examinations were
performed for complete evaluation of the gestation as well as the
maternal uterus, adnexal regions, and pelvic cul-de-sac.
Transvaginal technique was performed to assess early pregnancy.

[Series 1: us ob transvaginal · 0.20mm/px · 155 acquisitions, 13 frames shown]
[im 6/155]
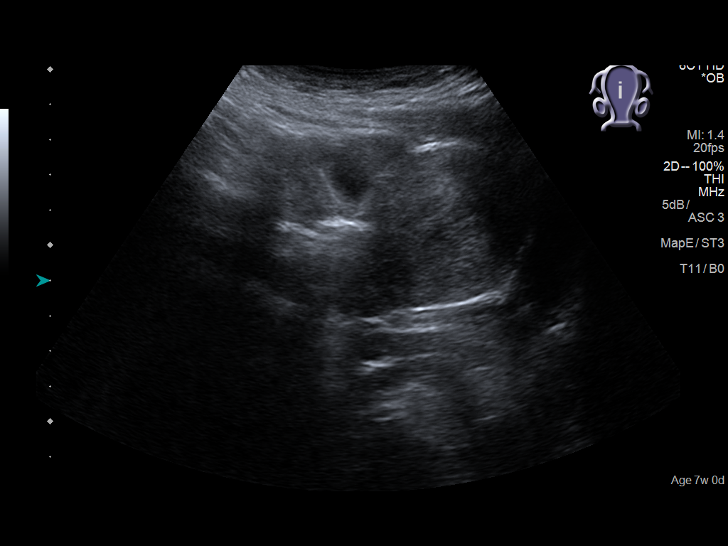
[im 18/155]
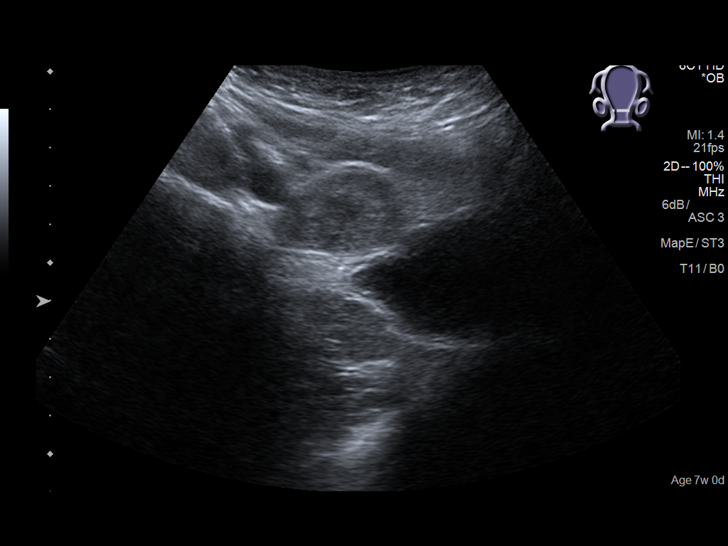
[im 29/155]
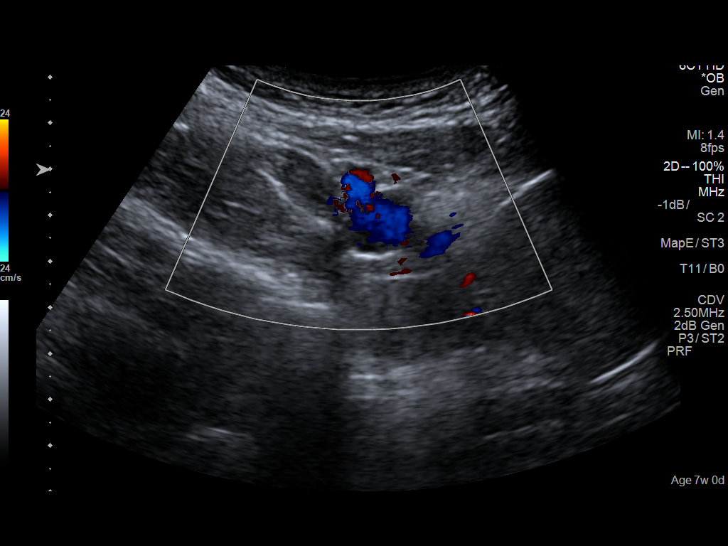
[im 40/155]
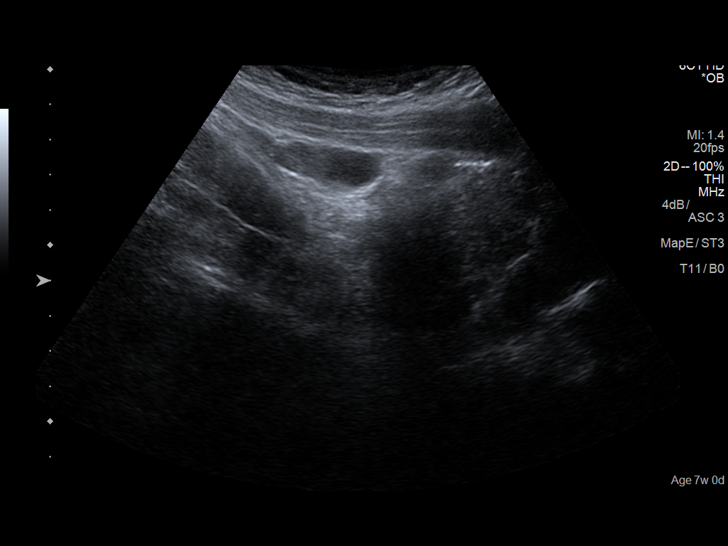
[im 52/155]
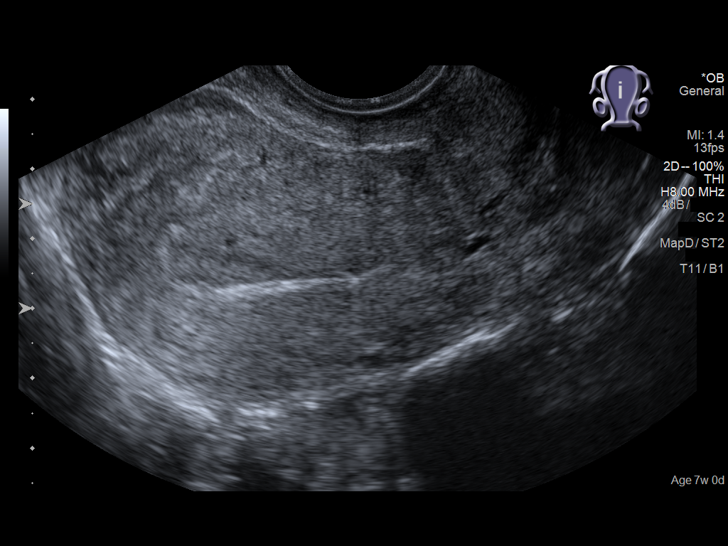
[im 63/155]
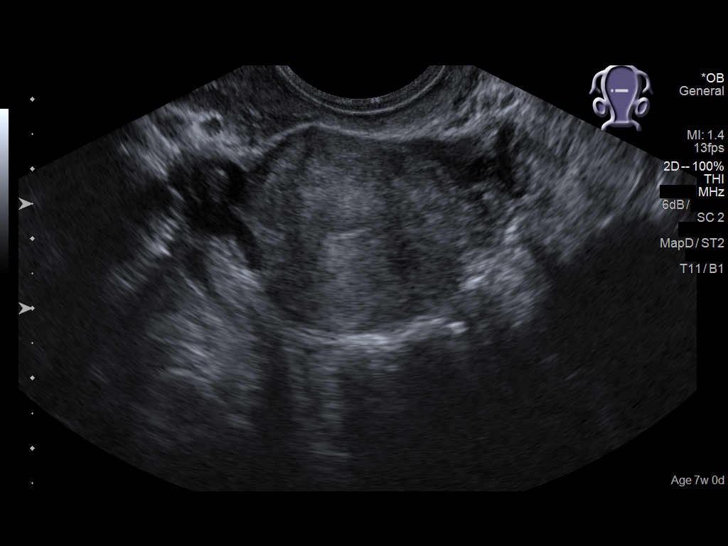
[im 80/155]
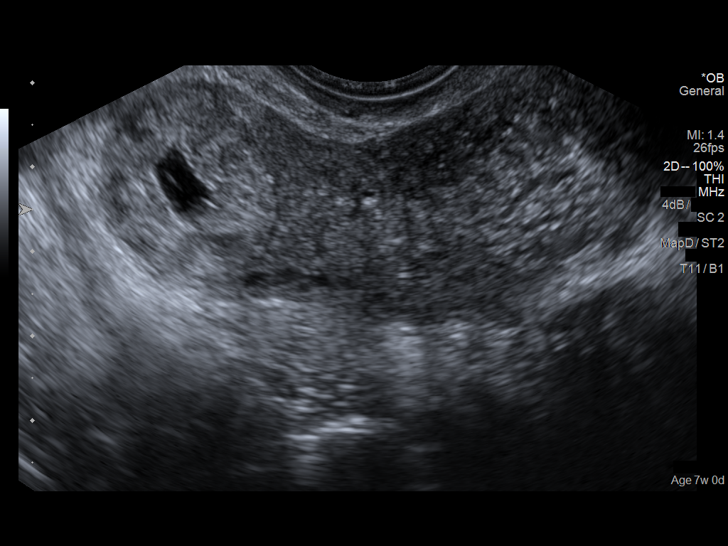
[im 92/155]
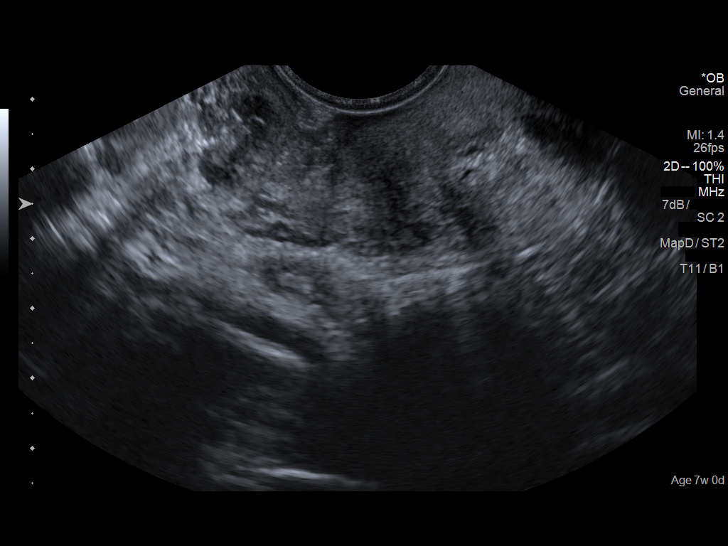
[im 103/155]
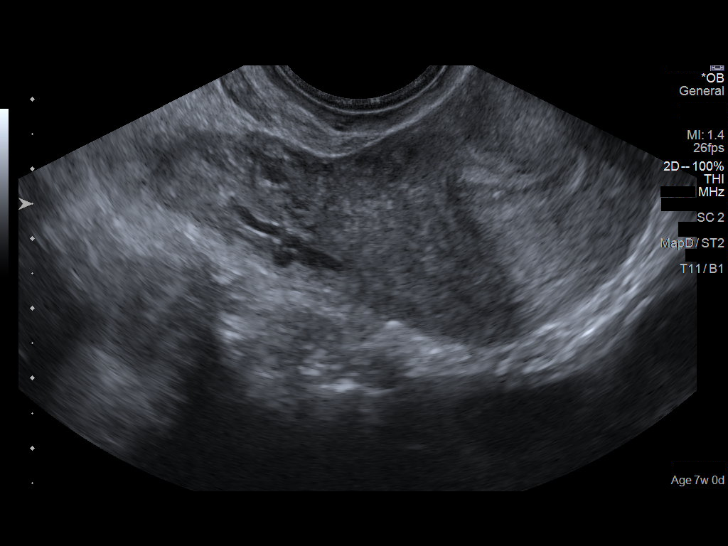
[im 115/155]
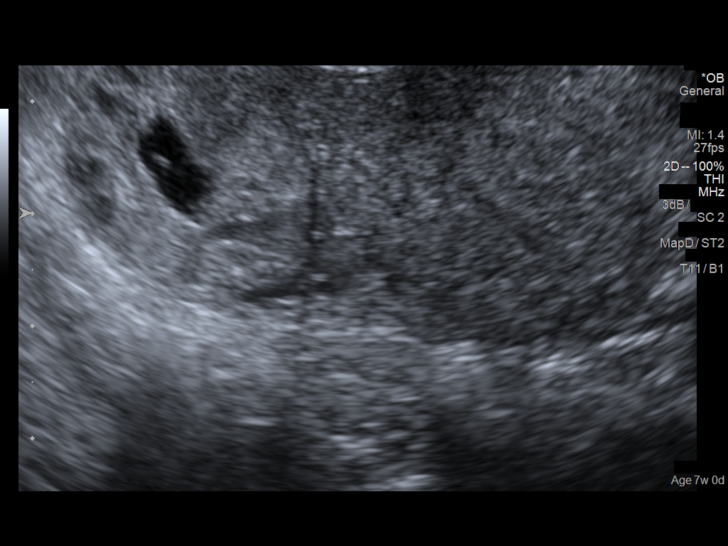
[im 126/155]
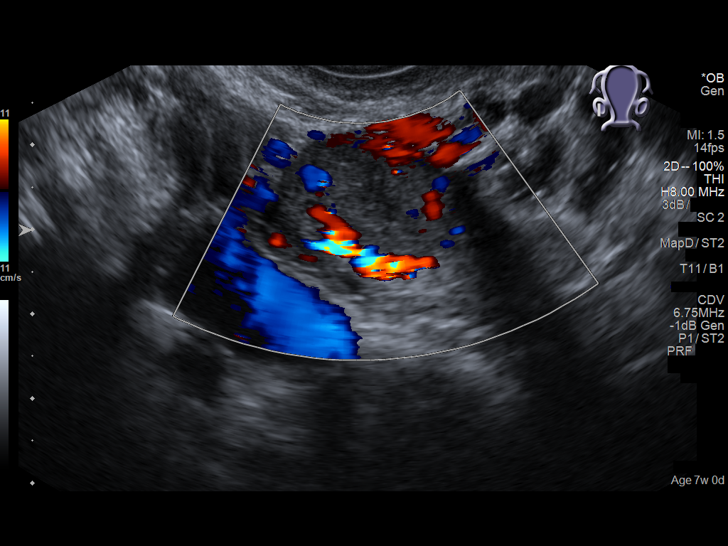
[im 137/155]
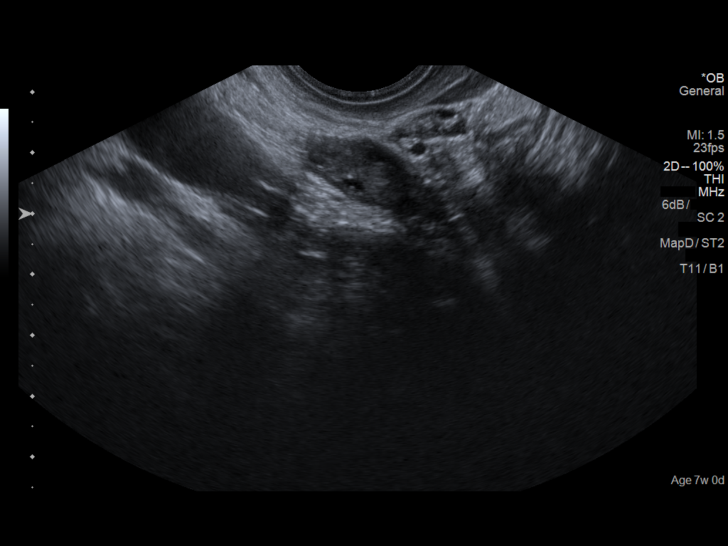
[im 149/155]
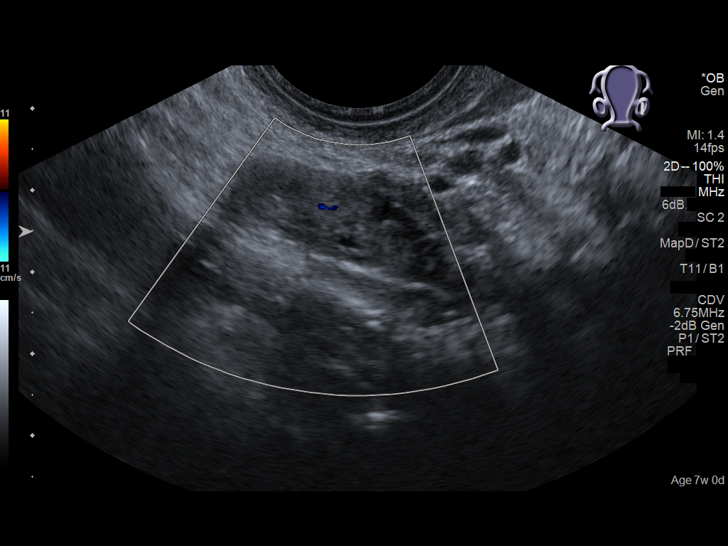

[13 of 28 positions shown; findings below may reference images not displayed]

FINDINGS: The uterus is anteverted and appears unremarkable. The endometrium
measures 9 mm in thickness. No intrauterine pregnancy identified.
Small amount of debris or blood product is noted within the canal.

There is a 2.4 x 2.0 x 2.1 cm complex mass with solid echogenic
periphery and central cystic component in the region of the right
adnexa close to the right cornua most consistent with an ectopic
pregnancy, likely tubal or interstitial in location. A 2 mm linear
echogenic structure within the central cystic component of this mass
may be artifactual or represent a fetal pole. If this structure is a
true fetal pole a corresponds to a 5 weeks, 5 days gestational age.
No definite cardiac activity was detected.

The ovaries are unremarkable.

No free fluid within the pelvis.
IMPRESSION: Findings most consistent with an ectopic pregnancy likely in the
right fallopian tube or interstitial location. Clinical correlation
and obstetrical consult is advised. A linear echogenic structure
within the ectopic pregnancy may be artifactual or represent a fetal
pole with an estimated gestational age of 5 weeks, 5 days. No
cardiac activity.

No free fluid within the pelvis.

These results were called by telephone at the time of interpretation
on 06/30/2017 at [DATE] to Dr. SEVIM SALDUZ , who verbally
acknowledged these results.

## 2019-08-08 IMAGING — US US OB COMP LESS 14 WK
1 series · 13 of 28 positions shown · non-contrast
Comparison: Ultrasound 06/30/2017

CLINICAL DATA: Ectopic pregnancy diagnosis 06/30/2017. Methotrexate
treatment on 06/30/2017. Continued RIGHT lower quadrant pain.

EXAM:
OBSTETRIC <14 WK US AND TRANSVAGINAL OB US
TECHNIQUE: Both transabdominal and transvaginal ultrasound examinations were
performed for complete evaluation of the gestation as well as the
maternal uterus, adnexal regions, and pelvic cul-de-sac.
Transvaginal technique was performed to assess early pregnancy.

[Series 1: us ob comp less 14 wk · 0.10mm/px · 13 of 138 slices shown]
[im 6/138]
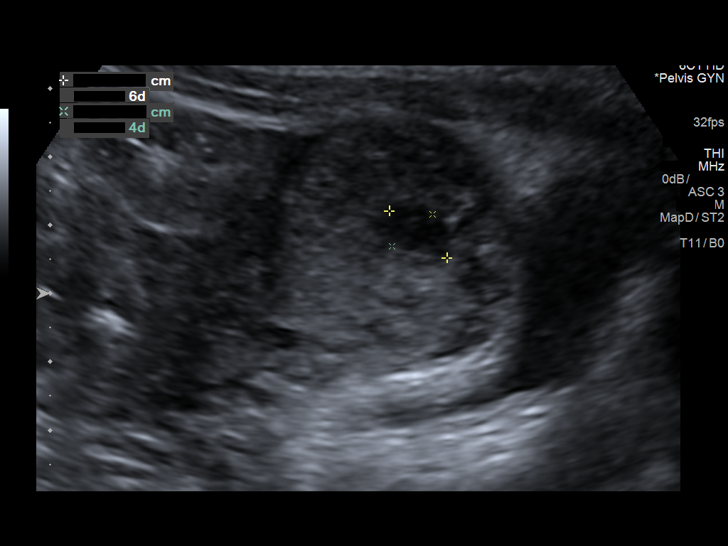
[im 16/138]
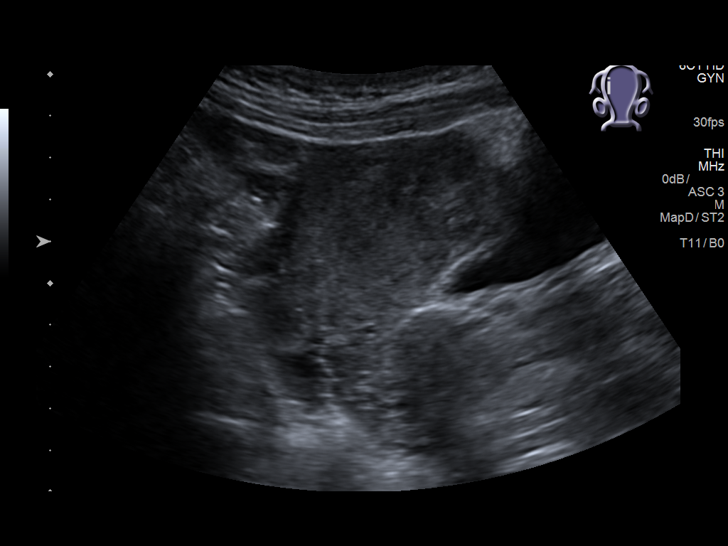
[im 26/138]
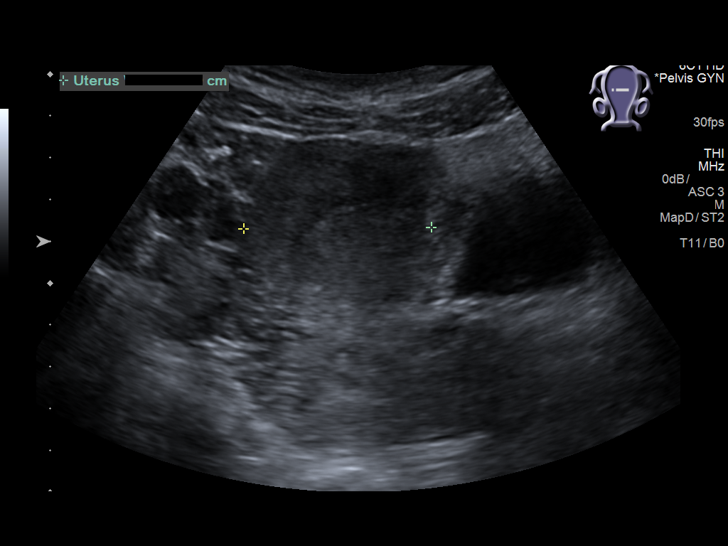
[im 36/138]
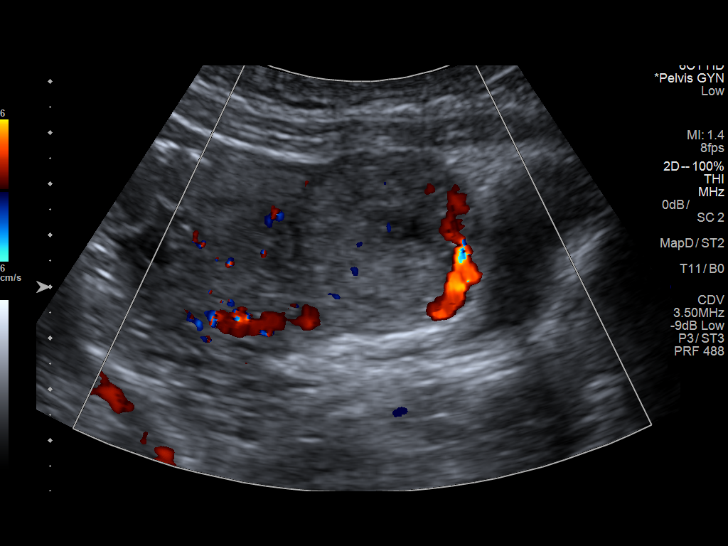
[im 46/138]
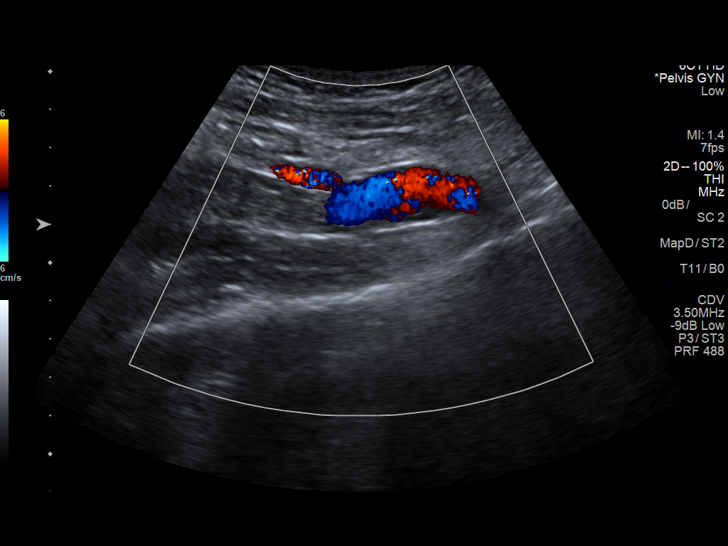
[im 56/138]
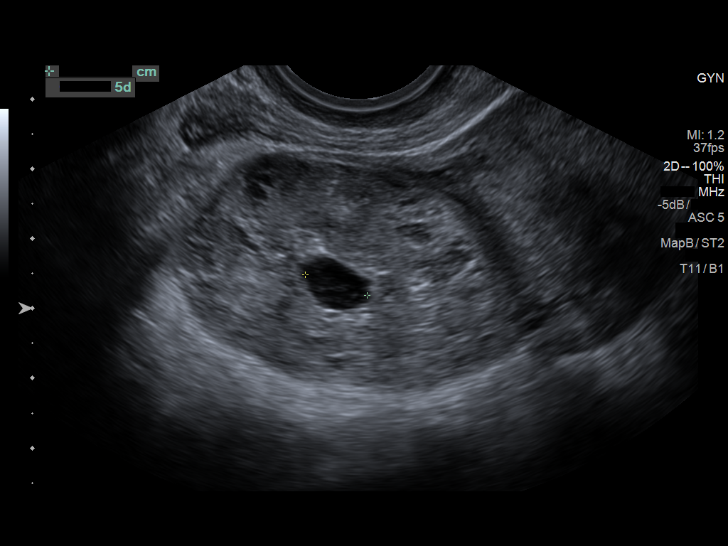
[im 72/138]
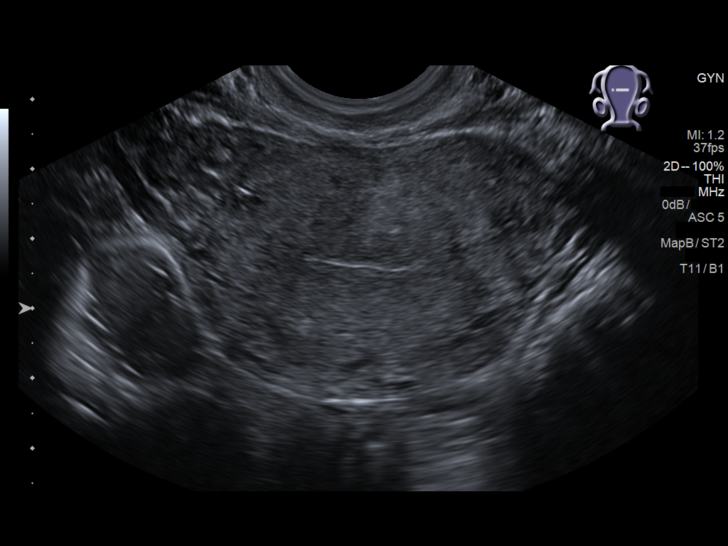
[im 82/138]
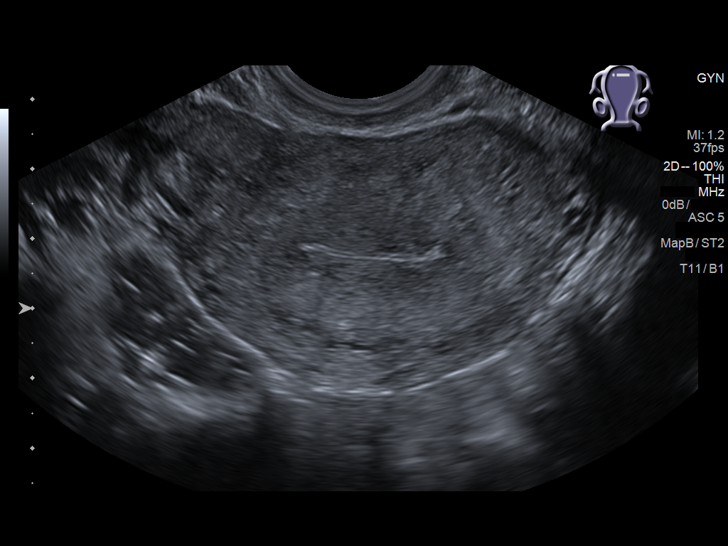
[im 92/138]
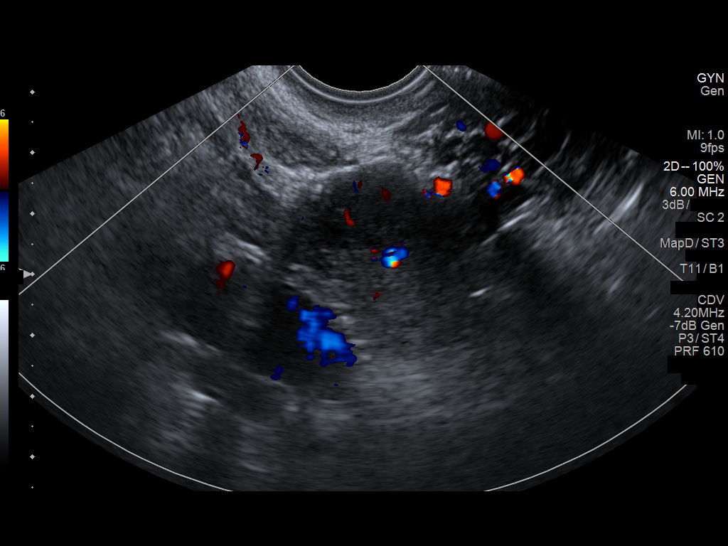
[im 102/138]
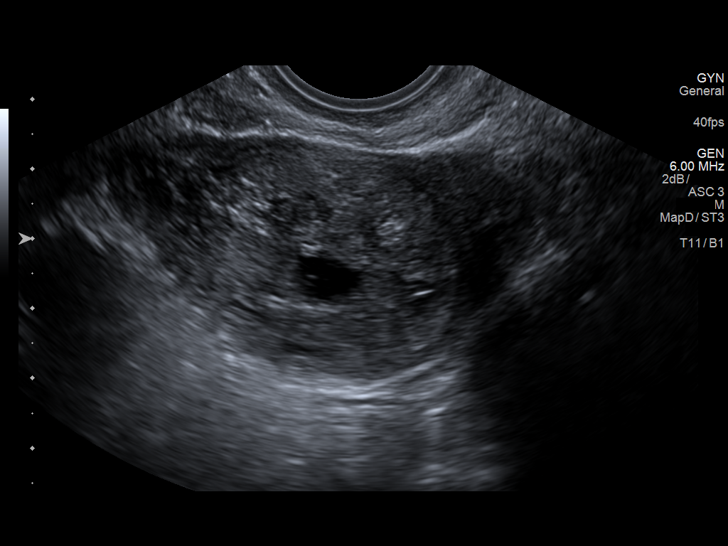
[im 112/138]
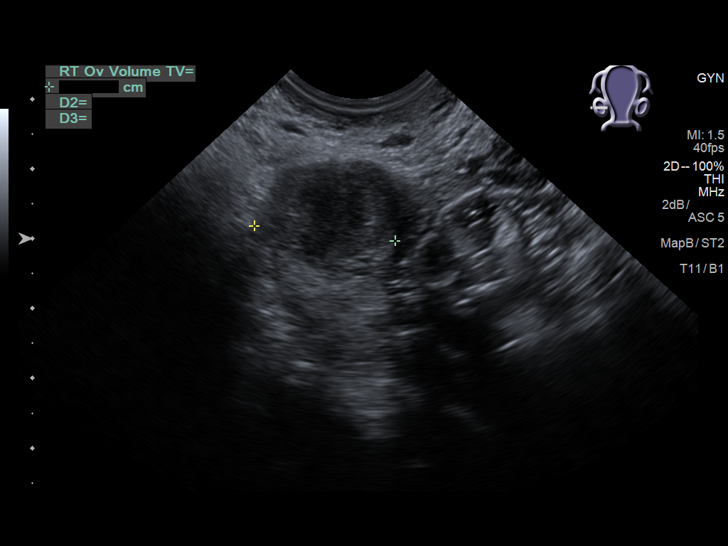
[im 122/138]
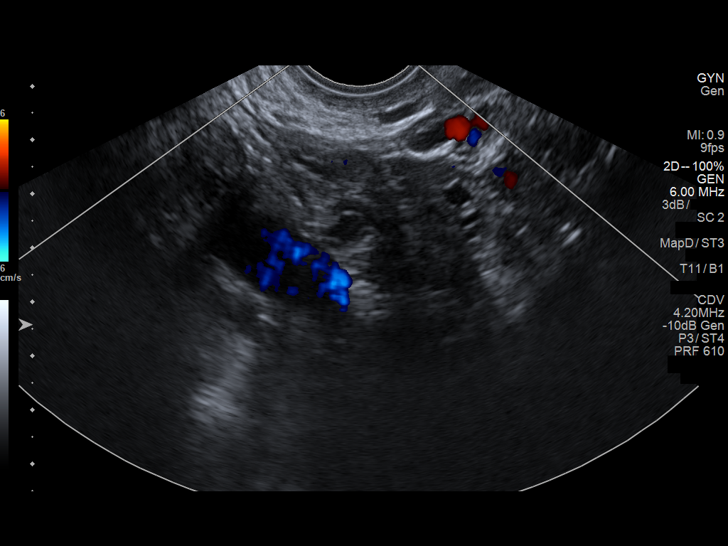
[im 132/138]
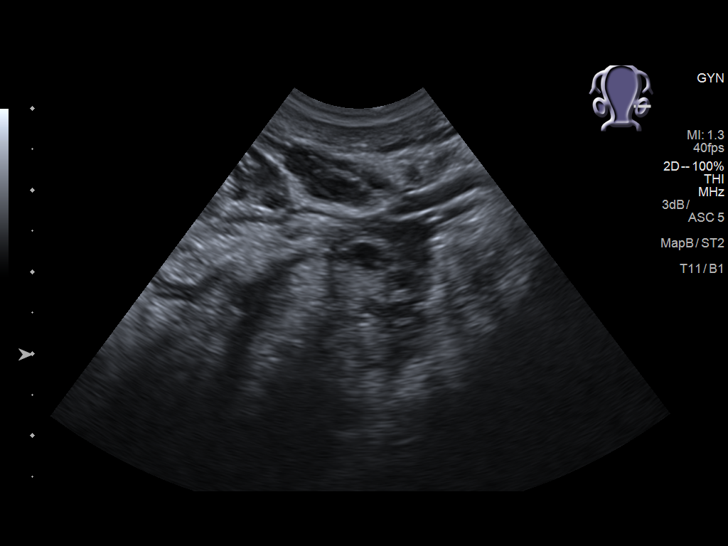

[13 of 28 positions shown; findings below may reference images not displayed]

FINDINGS: Intrauterine gestational sac: Not identified

Yolk sac:  Not identified

Embryo:  Not identified

Subchorionic hemorrhage:  None visualized.

Maternal uterus/adnexae: Rounded mass in the RIGHT adnexa is again
demonstrated with mild of blood flow on color Doppler imaging. This
mass measures 3.6 x 3.3 x 3.5 cm with a central sac-like component
measuring 7 mm. The mass is in the same location as the presumed
ectopic pregnancy on comparison exam. Mass measured 2.4 x 2.0 x
cm on comparison ultrasound with a 7 mm central sac-like lesion. The
vascularity was slightly higher on comparison exam. Overall this
mass lesion of the RIGHT adnexa appears very similar although
slightly enlarged.

The mass is immediately adjacent to the uterus and may be within the
cornu. The uterus appears to extend partially around the mass. No
free-fluid.
IMPRESSION: Persistent mass in the RIGHT adnexa / RIGHT cornu of the uterus with
central sac-like structure and persistent vascularity. The size of
lesion is increased mildly in the interval. The central sac-like
portion is unchanged. Differential would include adnexal ectopic
pregnancy versus corneal ectopic pregnancy. With mass lesion
contiguous with the uterus would also consider leiomyoma.

No free-fluid.

Recommend OB GYN consultation.

## 2019-08-30 ENCOUNTER — Telehealth: Payer: Self-pay

## 2019-08-30 ENCOUNTER — Other Ambulatory Visit: Payer: Self-pay

## 2019-10-19 ENCOUNTER — Encounter: Payer: Self-pay | Admitting: Obstetrics and Gynecology

## 2019-10-25 ENCOUNTER — Encounter: Payer: Self-pay | Admitting: Obstetrics and Gynecology

## 2019-11-01 ENCOUNTER — Other Ambulatory Visit: Payer: Self-pay

## 2019-11-01 ENCOUNTER — Ambulatory Visit: Payer: Self-pay | Admitting: Obstetrics and Gynecology

## 2019-11-01 ENCOUNTER — Encounter: Payer: Self-pay | Admitting: Obstetrics and Gynecology

## 2019-11-01 VITALS — BP 109/71 | HR 72 | Ht 67.0 in | Wt 137.6 lb

## 2019-11-01 DIAGNOSIS — K118 Other diseases of salivary glands: Secondary | ICD-10-CM

## 2019-11-01 NOTE — Progress Notes (Signed)
HPI:      [name] is a 24 y.o. G1P0 who LMP was Patient's last menstrual period was 10/26/2019.  Subjective:   She presents today complaining of midline swelling underneath her mandible.  She states that she feels a mass there and that it has been present for approximately 2 months.  She states is not increasing in size.  She reports it is nontender.  She denies any type of infection in her mouth tongue throat etc.  Denies sore throat.  No tongue or cheek piercings.    Hx: The following portions of the patient's history were reviewed and updated as appropriate:             She  has a past medical history of Heart murmur. She does not have a problem list on file. She  has no past surgical history on file. Her family history is not on file. She  reports that she has never smoked. She has never used smokeless tobacco. She reports that she does not drink alcohol or use drugs. She currently has no medications in their medication list. She is allergic to codeine.       Review of Systems:  Review of Systems  Constitutional: Denied constitutional symptoms, night sweats, recent illness, fatigue, fever, insomnia and weight loss.  Eyes: Denied eye symptoms, eye pain, photophobia, vision change and visual disturbance.  Ears/Nose/Throat/Neck: See HPI for additional information.  Cardiovascular: Denied cardiovascular symptoms, arrhythmia, chest pain/pressure, edema, exercise intolerance, orthopnea and palpitations.  Respiratory: Denied pulmonary symptoms, asthma, pleuritic pain, productive sputum, cough, dyspnea and wheezing.  Gastrointestinal: Denied, gastro-esophageal reflux, melena, nausea and vomiting.  Genitourinary: Denied genitourinary symptoms including symptomatic vaginal discharge, pelvic relaxation issues, and urinary complaints.  Musculoskeletal: Denied musculoskeletal symptoms, stiffness, swelling, muscle weakness and myalgia.  Dermatologic: Denied dermatology symptoms, rash and  scar.  Neurologic: Denied neurology symptoms, dizziness, headache, neck pain and syncope.  Psychiatric: Denied psychiatric symptoms, anxiety and depression.  Endocrine: Denied endocrine symptoms including hot flashes and night sweats.   Meds:   No current outpatient medications on file prior to visit.   No current facility-administered medications on file prior to visit.    Objective:     Vitals:   11/01/19 0859  BP: 109/71  Pulse: 72              Patient has a midline mobile nontender mass underneath her mandible.  Approximately 1 x 2 cm.  Assessment:    G1P0 There are no problems to display for this patient.    1. Mass of submandibular gland        Plan:            1.  Referral to ENT for further evaluation. Orders Orders Placed This Encounter  Procedures  . Ambulatory referral to ENT    No orders of the defined types were placed in this encounter.     F/U  Return for Pt to contact us if symptoms worsen. I spent 11 minutes involved in the care of this patient preparing to see the patient by obtaining and reviewing her medical history (including labs, imaging tests and prior procedures), documenting clinical information in the electronic health record (EHR), counseling and coordinating care plans, writing and sending prescriptions, ordering tests or procedures and directly communicating with the patient by discussing pertinent items from her history and physical exam as well as detailing my assessment and plan as noted above so that she has an informed understanding.  All of  her questions were answered.  Elonda Husky, M.D. 11/01/2019 9:52 AM

## 2020-06-21 ENCOUNTER — Encounter: Payer: Self-pay | Admitting: Obstetrics and Gynecology

## 2021-03-01 ENCOUNTER — Encounter: Payer: Self-pay | Admitting: Obstetrics and Gynecology

## 2021-03-06 ENCOUNTER — Encounter: Payer: Self-pay | Admitting: Obstetrics and Gynecology

## 2021-03-14 ENCOUNTER — Encounter: Payer: Self-pay | Admitting: Obstetrics and Gynecology

## 2021-03-18 ENCOUNTER — Encounter: Payer: Self-pay | Admitting: Obstetrics and Gynecology

## 2021-04-09 ENCOUNTER — Other Ambulatory Visit: Payer: Self-pay

## 2021-04-09 ENCOUNTER — Other Ambulatory Visit (HOSPITAL_COMMUNITY)
Admission: RE | Admit: 2021-04-09 | Discharge: 2021-04-09 | Disposition: A | Discharge status: Home / Self Care | Payer: Self-pay | Source: Ambulatory Visit | Attending: Obstetrics and Gynecology | Admitting: Obstetrics and Gynecology

## 2021-04-09 ENCOUNTER — Encounter: Payer: Self-pay | Admitting: Obstetrics and Gynecology

## 2021-04-09 ENCOUNTER — Ambulatory Visit (INDEPENDENT_AMBULATORY_CARE_PROVIDER_SITE_OTHER): Payer: Self-pay | Admitting: Obstetrics and Gynecology

## 2021-04-09 VITALS — BP 114/72 | HR 82 | Ht 67.0 in | Wt 119.8 lb

## 2021-04-09 DIAGNOSIS — N760 Acute vaginitis: Secondary | ICD-10-CM

## 2021-04-09 DIAGNOSIS — Z01419 Encounter for gynecological examination (general) (routine) without abnormal findings: Secondary | ICD-10-CM

## 2021-04-09 DIAGNOSIS — B9689 Other specified bacterial agents as the cause of diseases classified elsewhere: Secondary | ICD-10-CM

## 2021-04-09 DIAGNOSIS — Z0000 Encounter for general adult medical examination without abnormal findings: Secondary | ICD-10-CM | POA: Insufficient documentation

## 2021-04-09 MED ORDER — METRONIDAZOLE 500 MG PO TABS: 500.0000 mg | ORAL_TABLET | Freq: Two times a day (BID) | ORAL | 0 refills | Status: DC

## 2021-04-09 NOTE — Progress Notes (Signed)
Pt present for annual exam. Pt stated that she was doing well no problems.  

## 2021-04-09 NOTE — Progress Notes (Signed)
HPI:      [name] is a 25 y.o. G1P1001 who LMP was Patient's last menstrual period was 03/28/2021.  Subjective:   She presents today for her annual examination.  She is having regular cycles and using condoms for birth control.  She is happy with this method. She does state that she feels as if she has BV again.  She occasionally has an odor and some discharge.    Hx: The following portions of the patient's history were reviewed and updated as appropriate:             She  has a past medical history of Heart murmur. She does not have a problem list on file. She  has no past surgical history on file. Her family history includes Healthy in her father and mother; Hypertension in her maternal grandfather. She  reports that she has never smoked. She has never used smokeless tobacco. She reports that she does not drink alcohol and does not use drugs. She currently has no medications in their medication list. She is allergic to codeine.       Review of Systems:  Review of Systems  Constitutional: Denied constitutional symptoms, night sweats, recent illness, fatigue, fever, insomnia and weight loss.  Eyes: Denied eye symptoms, eye pain, photophobia, vision change and visual disturbance.  Ears/Nose/Throat/Neck: Denied ear, nose, throat or neck symptoms, hearing loss, nasal discharge, sinus congestion and sore throat.  Cardiovascular: Denied cardiovascular symptoms, arrhythmia, chest pain/pressure, edema, exercise intolerance, orthopnea and palpitations.  Respiratory: Denied pulmonary symptoms, asthma, pleuritic pain, productive sputum, cough, dyspnea and wheezing.  Gastrointestinal: Denied, gastro-esophageal reflux, melena, nausea and vomiting.  Genitourinary: See HPI for additional information.  Musculoskeletal: Denied musculoskeletal symptoms, stiffness, swelling, muscle weakness and myalgia.  Dermatologic: Denied dermatology symptoms, rash and scar.  Neurologic: Denied neurology  symptoms, dizziness, headache, neck pain and syncope.  Psychiatric: Denied psychiatric symptoms, anxiety and depression.  Endocrine: Denied endocrine symptoms including hot flashes and night sweats.   Meds:   No current outpatient medications on file prior to visit.   No current facility-administered medications on file prior to visit.     [facility] - 04/09/21 1349       Pregnancy Intention Screening   Does the patient want to become pregnant in the next year? No    Does the patient's partner want to become pregnant in the next year? No    Would the patient like to discuss contraceptive options today? No      Contraception Wrap Up   Current Method Female Condom    End Method Female Condom    Contraception Counseling Provided Yes            The pregnancy intention screening data noted above was reviewed. Potential methods of contraception were discussed. The patient elected to proceed with Female Condom.    Objective:     Vitals:   04/09/21 1334  BP: 114/72  Pulse: 82    Filed Weights   04/09/21 1334  Weight: 119 lb 12.8 oz (54.3 kg)              Physical examination General NAD, Conversant  HEENT Atraumatic; Op clear with mmm.  Normo-cephalic. Pupils reactive. Anicteric sclerae  Thyroid/Neck Smooth without nodularity or enlargement. Normal ROM.  Neck Supple.  Skin No rashes, lesions or ulceration. Normal palpated skin turgor. No nodularity.  Breasts: No masses or discharge.  Symmetric.  No axillary adenopathy.  Lungs: Clear to auscultation.No rales or wheezes. Normal  Respiratory effort, no retractions.  Heart: NSR.  No murmurs or rubs appreciated. No periferal edema  Abdomen: Soft.  Non-tender.  No masses.  No HSM. No hernia  Extremities: Moves all appropriately.  Normal ROM for age. No lymphadenopathy.  Neuro: Oriented to PPT.  Normal mood. Normal affect.     Pelvic:   Vulva: Normal appearance.  No lesions.  Vagina: No lesions or abnormalities noted.  Support:  Normal pelvic support.  Urethra No masses tenderness or scarring.  Meatus Normal size without lesions or prolapse.  Cervix: Normal appearance.  No lesions.  Anus: Normal exam.  No lesions.  Perineum: Normal exam.  No lesions.        Bimanual   Uterus: Normal size.  Non-tender.  Mobile.  AV.  Adnexae: No masses.  Non-tender to palpation.  Cul-de-sac: Negative for abnormality.   WET PREP: clue cells: present, KOH (yeast): negative, odor: present, and trichomoniasis: negative Ph:  > 4.5   Assessment:    G1P1001 There are no problems to display for this patient.    1. Well woman exam with routine gynecological exam   2. Bacterial vulvovaginitis        Plan:            1.  Basic Screening Recommendations The basic screening recommendations for asymptomatic women were discussed with the patient during her visit.  The age-appropriate recommendations were discussed with her and the rational for the tests reviewed.  When I am informed by the patient that another primary care physician has previously obtained the age-appropriate tests and they are up-to-date, only outstanding tests are ordered and referrals given as necessary.  Abnormal results of tests will be discussed with her when all of her results are completed.  Routine preventative health maintenance measures emphasized: Exercise/Diet/Weight control, Tobacco Warnings, Alcohol/Substance use risks and Stress Management Pap performed 2.  Flagyl for BV.  Discussed vaginal health and possible use of probiotics. Orders No orders of the defined types were placed in this encounter.   No orders of the defined types were placed in this encounter.         F/U  Return in about 1 year (around 04/09/2022) for Annual Physical.  Elonda Husky, M.D. 04/09/2021 2:03 PM

## 2021-04-09 NOTE — Patient Instructions (Signed)
Breast Self-Awareness Breast self-awareness is knowing how your breasts look and feel. Doing breast self-awareness is important. It allows you to catch a breast problem early while it is still small and can be treated. All women should do breast self-awareness, including women who have had breast implants. Tell your doctorif you notice a change in your breasts. What you need: A mirror. A well-lit room. How to do a breast self-exam A breast self-exam is one way to learn what is normal for your breasts and tocheck for changes. To do a breast self-exam: Look for changes  Take off all the clothes above your waist. Stand in front of a mirror in a room with good lighting. Put your hands on your hips. Push your hands down. Look at your breasts and nipples in the mirror to see if one breast or nipple looks different from the other. Check to see if: The shape of one breast is different. The size of one breast is different. There are wrinkles, dips, and bumps in one breast and not the other. Look at each breast for changes in the skin, such as: Redness. Scaly areas. Look for changes in your nipples, such as: Liquid around the nipples. Bleeding. Dimpling. Redness. A change in where the nipples are.  Feel for changes  Lie on your back on the floor. Feel each breast. To do this, follow these steps: Pick a breast to feel. Put the arm closest to that breast above your head. Use your other arm to feel the nipple area of your breast. Feel the area with the pads of your three middle fingers by making small circles with your fingers. For the first circle, press lightly. For the second circle, press harder. For the third circle, press even harder. Keep making circles with your fingers at the different pressures as you move down your breast. Stop when you feel your ribs. Move your fingers a little toward the center of your body. Start making circles with your fingers again, this time going up until  you reach your collarbone. Keep making up-and-down circles until you reach your armpit. Remember to keep using the three pressures. Feel the other breast in the same way. Sit or stand in the tub or shower. With soapy water on your skin, feel each breast the same way you did in step 2 when you were lying on the floor.  Write down what you find Writing down what you find can help you remember what to tell your doctor. Write down: What is normal for each breast. Any changes you find in each breast, including: The kind of changes you find. Whether you have pain. Size and location of any lumps. When you last had your menstrual period. General tips Check your breasts every month. If you are breastfeeding, the best time to check your breasts is after you feed your baby or after you use a breast pump. If you get menstrual periods, the best time to check your breasts is 5-7 days after your menstrual period is over. With time, you will become comfortable with the self-exam, and you will begin to know if there are changes in your breasts. Contact a doctor if you: See a change in the shape or size of your breasts or nipples. See a change in the skin of your breast or nipples, such as red or scaly skin. Have fluid coming from your nipples that is not normal. Find a lump or thick area that was not there before. Have pain in   your breasts. Have any concerns about your breast health. Summary Breast self-awareness includes looking for changes in your breasts, as well as feeling for changes within your breasts. Breast self-awareness should be done in front of a mirror in a well-lit room. You should check your breasts every month. If you get menstrual periods, the best time to check your breasts is 5-7 days after your menstrual period is over. Let your doctor know of any changes you see in your breasts, including changes in size, changes on the skin, pain or tenderness, or fluid from your nipples that is  not normal. This information is not intended to replace advice given to you by your health care provider. Make sure you discuss any questions you have with your healthcare provider. Document Revised: 03/16/2018 Document Reviewed: 03/16/2018 Elsevier Patient Education  2022 [employer] Preventive Care 21-39 Years Old, Female Preventive care refers to lifestyle choices and visits with your health care provider that can promote health and wellness. This includes: A yearly physical exam. This is also called an annual wellness visit. Regular dental and eye exams. Immunizations. Screening for certain conditions. Healthy lifestyle choices, such as: Eating a healthy diet. Getting regular exercise. Not using drugs or products that contain nicotine and tobacco. Limiting alcohol use. What can I expect for my preventive care visit? Physical exam Your health care provider may check your: Height and weight. These may be used to calculate your BMI (body mass index). BMI is a measurement that tells if you are at a healthy weight. Heart rate and blood pressure. Body temperature. Skin for abnormal spots. Counseling Your health care provider may ask you questions about your: Past medical problems. Family's medical history. Alcohol, tobacco, and drug use. Emotional well-being. Home life and relationship well-being. Sexual activity. Diet, exercise, and sleep habits. Work and work environment. Access to firearms. Method of birth control. Menstrual cycle. Pregnancy history. What immunizations do I need?  Vaccines are usually given at various ages, according to a schedule. Your health care provider will recommend vaccines for you based on your age, medicalhistory, and lifestyle or other factors, such as travel or where you work. What tests do I need?  Blood tests Lipid and cholesterol levels. These may be checked every 5 years starting at age 20. Hepatitis C test. Hepatitis B  test. Screening Diabetes screening. This is done by checking your blood sugar (glucose) after you have not eaten for a while (fasting). STD (sexually transmitted disease) testing, if you are at risk. BRCA-related cancer screening. This may be done if you have a family history of breast, ovarian, tubal, or peritoneal cancers. Pelvic exam and Pap test. This may be done every 3 years starting at age 21. Starting at age 30, this may be done every 5 years if you have a Pap test in combination with an HPV test. Talk with your health care provider about your test results, treatment options,and if necessary, the need for more tests. Follow these instructions at home: Eating and drinking  Eat a healthy diet that includes fresh fruits and vegetables, whole grains, lean protein, and low-fat dairy products. Take vitamin and mineral supplements as recommended by your health care provider. Do not drink alcohol if: Your health care provider tells you not to drink. You are pregnant, may be pregnant, or are planning to become pregnant. If you drink alcohol: Limit how much you have to 0-1 drink a day. Be aware of how much alcohol is in your drink. In the [place] one   drink equals one 12 oz bottle of beer (355 mL), one 5 oz glass of wine (148 mL), or one 1 oz glass of hard liquor (44 mL).  Lifestyle Take daily care of your teeth and gums. Brush your teeth every morning and night with fluoride toothpaste. Floss one time each day. Stay active. Exercise for at least 30 minutes 5 or more days each week. Do not use any products that contain nicotine or tobacco, such as cigarettes, e-cigarettes, and chewing tobacco. If you need help quitting, ask your health care provider. Do not use drugs. If you are sexually active, practice safe sex. Use a condom or other form of protection to prevent STIs (sexually transmitted infections). If you do not wish to become pregnant, use a form of birth control. If you plan to become  pregnant, see your health care provider for a prepregnancy visit. Find healthy ways to cope with stress, such as: Meditation, yoga, or listening to music. Journaling. Talking to a trusted person. Spending time with friends and family. Safety Always wear your seat belt while driving or riding in a vehicle. Do not drive: If you have been drinking alcohol. Do not ride with someone who has been drinking. When you are tired or distracted. While texting. Wear a helmet and other protective equipment during sports activities. If you have firearms in your house, make sure you follow all gun safety procedures. Seek help if you have been physically or sexually abused. What's next? Go to your health care provider once a year for an annual wellness visit. Ask your health care provider how often you should have your eyes and teeth checked. Stay up to date on all vaccines. This information is not intended to replace advice given to you by your health care provider. Make sure you discuss any questions you have with your healthcare provider. Document Revised: 03/25/2020 Document Reviewed: 04/08/2018 Elsevier Patient Education  2022 Elsevier Inc.  

## 2021-04-11 LAB — CYTOLOGY - PAP
Comment: NEGATIVE
Diagnosis: NEGATIVE
High risk HPV: NEGATIVE

## 2021-06-27 ENCOUNTER — Emergency Department
Admission: EM | Admit: 2021-06-27 | Discharge: 2021-06-27 | Disposition: A | Discharge status: Home / Self Care | Payer: Self-pay | Attending: Emergency Medicine | Admitting: Emergency Medicine

## 2021-06-27 ENCOUNTER — Emergency Department: Payer: Self-pay

## 2021-06-27 ENCOUNTER — Other Ambulatory Visit: Payer: Self-pay

## 2021-06-27 ENCOUNTER — Encounter: Payer: Self-pay | Admitting: Emergency Medicine

## 2021-06-27 DIAGNOSIS — O209 Hemorrhage in early pregnancy, unspecified: Secondary | ICD-10-CM

## 2021-06-27 DIAGNOSIS — N939 Abnormal uterine and vaginal bleeding, unspecified: Secondary | ICD-10-CM

## 2021-06-27 DIAGNOSIS — R102 Pelvic and perineal pain: Secondary | ICD-10-CM

## 2021-06-27 DIAGNOSIS — N9489 Other specified conditions associated with female genital organs and menstrual cycle: Secondary | ICD-10-CM | POA: Insufficient documentation

## 2021-06-27 DIAGNOSIS — N83202 Unspecified ovarian cyst, left side: Secondary | ICD-10-CM | POA: Insufficient documentation

## 2021-06-27 LAB — URINALYSIS, ROUTINE W REFLEX MICROSCOPIC
Bacteria, UA: NONE SEEN
Bilirubin Urine: NEGATIVE
Glucose, UA: NEGATIVE mg/dL
Ketones, ur: NEGATIVE mg/dL
Leukocytes,Ua: NEGATIVE
Nitrite: NEGATIVE
Protein, ur: NEGATIVE mg/dL
Specific Gravity, Urine: 1.014 (ref 1.005–1.030)
pH: 8 (ref 5.0–8.0)

## 2021-06-27 LAB — CBC
HCT: 37.9 % (ref 36.0–46.0)
Hemoglobin: 12.7 g/dL (ref 12.0–15.0)
MCH: 29.1 pg (ref 26.0–34.0)
MCHC: 33.5 g/dL (ref 30.0–36.0)
MCV: 86.7 fL (ref 80.0–100.0)
Platelets: 272 10*3/uL (ref 150–400)
RBC: 4.37 MIL/uL (ref 3.87–5.11)
RDW: 14 % (ref 11.5–15.5)
WBC: 10.2 10*3/uL (ref 4.0–10.5)
nRBC: 0 % (ref 0.0–0.2)

## 2021-06-27 LAB — COMPREHENSIVE METABOLIC PANEL
ALT: 22 U/L (ref 0–44)
AST: 21 U/L (ref 15–41)
Albumin: 4.5 g/dL (ref 3.5–5.0)
Alkaline Phosphatase: 39 U/L (ref 38–126)
Anion gap: 8 (ref 5–15)
BUN: 9 mg/dL (ref 6–20)
CO2: 24 mmol/L (ref 22–32)
Calcium: 9.1 mg/dL (ref 8.9–10.3)
Chloride: 105 mmol/L (ref 98–111)
Creatinine, Ser: 0.66 mg/dL (ref 0.44–1.00)
GFR, Estimated: >60 mL/min (ref >60)
Glucose, Bld: 106 mg/dL — ABNORMAL HIGH (ref 70–99)
Potassium: 3.6 mmol/L (ref 3.5–5.1)
Sodium: 137 mmol/L (ref 135–145)
Total Bilirubin: 0.8 mg/dL (ref 0.3–1.2)
Total Protein: 7.6 g/dL (ref 6.5–8.1)

## 2021-06-27 LAB — PREGNANCY, URINE: Preg Test, Ur: NEGATIVE

## 2021-06-27 LAB — LIPASE, BLOOD: Lipase: 32 U/L (ref 11–51)

## 2021-06-27 LAB — HCG, QUANTITATIVE, PREGNANCY: hCG, Beta Chain, Quant, S: <1 m[IU]/mL (ref <5)

## 2021-06-27 MED ORDER — ONDANSETRON 4 MG PO TBDP
4.0000 mg | ORAL_TABLET | Freq: Once | ORAL | Status: AC
  Administered 2021-06-27: 14:00:00 4 mg via ORAL

## 2021-06-27 MED ORDER — ONDANSETRON 4 MG PO TBDP
ORAL_TABLET | ORAL | Status: AC
  Filled 2021-06-27: qty 1

## 2021-06-27 NOTE — ED Provider Notes (Signed)
[facility] Emergency Department Provider Note    ____________________________________________   Event Date/Time   First MD Initiated Contact with Patient 06/27/21 1504     (approximate)  I have reviewed the triage vital signs and the nursing notes.   HISTORY  Chief Complaint Abdominal Pain and Emesis   HPI Teresa Bishop is a 25 y.o. female, history of ectopic pregnancy 3 years prior, presents to the emergency department for evaluation of abdominal pain and vomiting.  She states that her abdominal pain began last night around midnight; described as sudden onset, 6/10, sharp, and mostly localized to the left lower quadrant.  Pain has remained mostly constant, but creases with vomiting.  She has had approximately 3-4 episodes of vomiting (non-bloody). Additionally endorses mild vaginal bleeding that started a few hours ago.  She is not currently on her menstrual cycle.  She states that she did take Plan B over the weekend and was concerned that this may be causing her symptoms.  Denies fever/chills, chest pain, back pain, urinary symptoms, or diarrhea  History limited by: None.  Past Medical History:  Diagnosis Date   Heart murmur     There are no problems to display for this patient.   History reviewed. No pertinent surgical history.  Prior to Admission medications   Medication Sig Start Date End Date Taking? Authorizing Provider  metroNIDAZOLE (FLAGYL) 500 MG tablet Take 1 tablet (500 mg total) by mouth 2 (two) times daily. 04/09/21   Harlin Heys, MD    Allergies Codeine  Family History  Problem Relation Age of Onset   Healthy Mother    Healthy Father    Hypertension Maternal Grandfather    Cancer Neg Hx    Diabetes Neg Hx     Social History Social History   Tobacco Use   Smoking status: Never   Smokeless tobacco: Never  Vaping Use   Vaping Use: Every day   Substances: Nicotine, Flavoring  Substance Use Topics   Alcohol use: No    Drug use: No    Review of Systems  Constitutional: Negative for fever/chills, weight loss, or fatigue.  Eyes: Negative for visual changes or discharge.  ENT: Negative for congestion, hearing changes, or sore throat.  Gastrointestinal: Positive for abdominal pain and vomiting.  Genitourinary: Negative for dysuria or hematuria.  Musculoskeletal: Negative for back pain or joint pain.  Skin: Negative for rashes or lesions.  Neurological: Negative for headache, syncope, dizziness, tremors, or numbness/tingling.   10-point ROS otherwise negative. ____________________________________________   PHYSICAL EXAM:  VITAL SIGNS: ED Triage Vitals  Enc Vitals Group     BP 06/27/21 1312 113/69     Pulse Rate 06/27/21 1312 (!) 103     Resp 06/27/21 1312 17     Temp 06/27/21 1312 97.8 F (36.6 C)     Temp Source 06/27/21 1312 Oral     SpO2 06/27/21 1312 100 %     Weight 06/27/21 1312 119 lb 11.4 oz (54.3 kg)     Height 06/27/21 1312 5\' 7"  (1.702 m)     Head Circumference --      Peak Flow --      Pain Score 06/27/21 1312 6     Pain Loc --      Pain Edu? --      Excl. in [place]? --     Physical Exam Constitutional:      General: She is not in acute distress.    Appearance: She is well-developed.  She is not ill-appearing.  HENT:     Head: Normocephalic and atraumatic.  Cardiovascular:     Rate and Rhythm: Normal rate and regular rhythm.     Heart sounds: Normal heart sounds. No murmur heard. Pulmonary:     Effort: Pulmonary effort is normal.     Breath sounds: Normal breath sounds.  Abdominal:     General: Abdomen is flat. There is no distension. There are no signs of injury.     Palpations: Abdomen is soft. There is no hepatomegaly, splenomegaly or pulsatile mass.     Tenderness: There is abdominal tenderness in the left lower quadrant. There is no right CVA tenderness, left CVA tenderness, guarding or rebound. Negative signs include Murphy's sign, Rovsing's sign, McBurney's sign  and psoas sign.  Skin:    General: Skin is warm and dry.  Neurological:     General: No focal deficit present.     Mental Status: She is alert and oriented to person, place, and time.  Psychiatric:        Mood and Affect: Mood normal.        Behavior: Behavior normal.     ____________________________________________    LABS  (all labs ordered are listed, but only abnormal results are displayed)  Labs Reviewed  COMPREHENSIVE METABOLIC PANEL - Abnormal; Notable for the following components:      Result Value   Glucose, Bld 106 (*)    All other components within normal limits  URINALYSIS, ROUTINE W REFLEX MICROSCOPIC - Abnormal; Notable for the following components:   Color, Urine YELLOW (*)    APPearance CLEAR (*)    Hgb urine dipstick MODERATE (*)    All other components within normal limits  LIPASE, BLOOD  CBC  PREGNANCY, URINE  HCG, QUANTITATIVE, PREGNANCY     ____________________________________________   EKG None.   ____________________________________________    RADIOLOGY I personally viewed and evaluated these images as part of my medical decision making, as well as reviewing the written report by the radiologist.  ED Provider Interpretation: I agree with the radiologist interpretation.  Small complex cyst measuring 2 cm in diameter located on the left ovary.  Negative for ovarian torsion. No ectopic pregnancy found.  US PELVIC COMPLETE W TRANSVAGINAL AND TORSION R/O  Result Date: 06/27/2021 CLINICAL DATA:  Pelvic pain and vaginal bleeding. Left lower quadrant pain. EXAM: TRANSABDOMINAL AND TRANSVAGINAL ULTRASOUND OF PELVIS DOPPLER ULTRASOUND OF OVARIES TECHNIQUE: Both transabdominal and transvaginal ultrasound examinations of the pelvis were performed. Transabdominal technique was performed for global imaging of the pelvis including uterus, ovaries, adnexal regions, and pelvic cul-de-sac. It was necessary to proceed with endovaginal exam following the  transabdominal exam to visualize the endometrium, ovaries and adnexa. Color and duplex Doppler ultrasound was utilized to evaluate blood flow to the ovaries. COMPARISON:  None. FINDINGS: Uterus Measurements: 8.7 x 3.9 x 3.6 cm = volume: 63 mL. The uterus is anteverted. No fibroids or other mass visualized. Endometrium Thickness: 3 mm, normal.  No focal abnormality visualized. Right ovary Measurements: 2.9 x 1.8 x 2.0 cm = volume: 5.3 mL. Normal appearance with normal blood flow. Physiologic follicles. No cyst or adnexal mass. Left ovary Measurements: 3.3 x 1.9 x 2.2 cm = volume: 7.0 mL. Small complex cyst measuring 2 cm with low level internal echoes, likely resolving hemorrhagic or corpus luteal cyst. There is peripheral blood flow, as well as normal blood flow to the ovarian parenchyma. No adnexal mass. Pulsed Doppler evaluation of both ovaries demonstrates normal low-resistance  arterial and venous waveforms. Other findings Small amount of simple free fluid in the cul-de-sac. IMPRESSION: 1. Normal sonographic appearance of the uterus and endometrium. 2. Small complex cyst in the left ovary measuring 2 cm is likely resolving hemorrhagic or corpus luteal cyst. This needs no dedicated imaging follow-up or further characterization. 3. Normal sonographic appearance of the right ovary. 4. Normal ovarian blood flow without torsion. Electronically Signed   By: Narda Rutherford M.D.   On: 06/27/2021 18:37    ____________________________________________   PROCEDURES  Procedures   Medications  ondansetron (ZOFRAN-ODT) disintegrating tablet 4 mg (4 mg Oral Given 06/27/21 1406)    Critical Care performed: No  ____________________________________________   INITIAL IMPRESSION / ASSESSMENT AND PLAN / ED COURSE  Pertinent labs & imaging results that were available during my care of the patient were reviewed by me and considered in my medical decision making (see chart for details).       Teresa Bishop is a  25 y.o. female, history of ectopic pregnancy 3 years prior, presents to the emergency department for evaluation of lower left quadrant pain.   Differentials include, but not limited to: Serious: appendicitis, ectopic pregnancy, ovarian torsion, diverticulitis, bowel obstruction, colitis Common: UTI/pyelonephritis, renal colic, ovarian cyst, endometriosis, hernia, fibroids   On exam, patient appears well.  Nontoxic.  Patient has notable tenderness with deep palpation to the lower left quadrant, but otherwise no distention or guarding.  Negative rebound tenderness, Rovsing sign, or psoas sign.  Vital signs are within normal limits.  Patient treated initially with ondansetron.   Initial labs show negative pregnancy test, unremarkable lipase, and unremarkable CBC and CMP.  Moderate hemoglobin noted in the urinalysis, otherwise unremarkable.  Transvaginal ultrasound shows small complex cyst measuring 2 cm on the left ovary, likely resulting hemorrhagic cyst.  No ectopic pregnancy found.  I suspect this to be the etiology to the patient's sudden onset of abdominal pain.   Upon reexamination, patient appears well.  She states that her abdominal pain has reduced significantly and her vomiting has been controlled. No further imaging or treatment indicated at this time.  We will plan to discharge this patient with strict return precautions and OB/GYN follow-up.     ____________________________________________   FINAL CLINICAL IMPRESSION(S) / ED DIAGNOSES  Final diagnoses:  Cyst of left ovary     NEW MEDICATIONS STARTED DURING THIS VISIT:  ED Discharge Orders     None        Note:  This document was prepared using Dragon voice recognition software and may include unintentional dictation errors.    Varney Daily, [place] 06/28/21 1005    Sharyn Creamer, MD 06/29/21 [phone]

## 2021-06-27 NOTE — ED Provider Notes (Signed)
Emergency Medicine Provider Triage Evaluation Note  Teresa Bishop, a 25 y.o. female  was evaluated in triage.  Pt complains of lower abdominal cramping with associated nausea and vomiting with onset last night.  She denies any diarrhea, fever, chills, or sweats but she also denies any abnormal vaginal discharge.  Review of Systems  Positive: Abdominal cramping, N/V Negative: FCS  Physical Exam  BP 113/69 (BP Location: Right Arm)   Pulse (!) 103   Temp 97.8 F (36.6 C) (Oral)   Resp 17   Ht 5\' 7"  (1.702 m)   Wt 54.3 kg   LMP 06/12/2021   SpO2 100%   BMI 18.75 kg/m  Gen:   Awake, no distress  NAD Resp:  Normal effort CTA MSK:   Moves extremities without difficulty  Other:  ABD: soft, nontender  Medical Decision Making  Medically screening exam initiated at 1:57 PM.  Appropriate orders placed.  Teresa Bishop was informed that the remainder of the evaluation will be completed by another provider, this initial triage assessment does not replace that evaluation, and the importance of remaining in the ED until their evaluation is complete.  Patient ED evaluation of lower abdominal cramping with associated nausea and vomiting with some intermittent spotting.   Bobette Mo, PA-C 06/27/21 1425    06/29/21, MD 06/27/21 1710

## 2021-06-27 NOTE — ED Notes (Signed)
Pt to ED c/o Lower Abdominal pain and emesis that started last night. Pt having low back pain and cramping as well. Denies diarrhea.   Pt states she has started spotting since being here, last period was a week ago.   Pt is A&Ox4, nad.

## 2021-06-27 NOTE — ED Triage Notes (Signed)
Pt comes into the ED via POV c/o lower abd cramping and nausea/vomiting that started last night.  Pt ambulatory and in NAD.  Denies any diarrhea or fevers.  Pt has even and unlabored respirations.

## 2021-07-01 ENCOUNTER — Encounter: Payer: Self-pay | Admitting: Obstetrics and Gynecology

## 2021-11-26 IMAGING — US US PELVIS COMPLETE TRANSABD/TRANSVAG W DUPLEX
1 series · 13 of 25 positions shown · non-contrast
Comparison: None.

CLINICAL DATA: Pelvic pain and vaginal bleeding. Left lower
quadrant pain.

EXAM:
TRANSABDOMINAL AND TRANSVAGINAL ULTRASOUND OF PELVIS
DOPPLER ULTRASOUND OF OVARIES
TECHNIQUE: Both transabdominal and transvaginal ultrasound examinations of the
pelvis were performed. Transabdominal technique was performed for
global imaging of the pelvis including uterus, ovaries, adnexal
regions, and pelvic cul-de-sac.
It was necessary to proceed with endovaginal exam following the
transabdominal exam to visualize the endometrium, ovaries and
adnexa. Color and duplex Doppler ultrasound was utilized to evaluate
blood flow to the ovaries.

[Series 1: us pelvic complete w transvaginal and torsion righ · 13 of 104 slices shown]
[im 1/104]
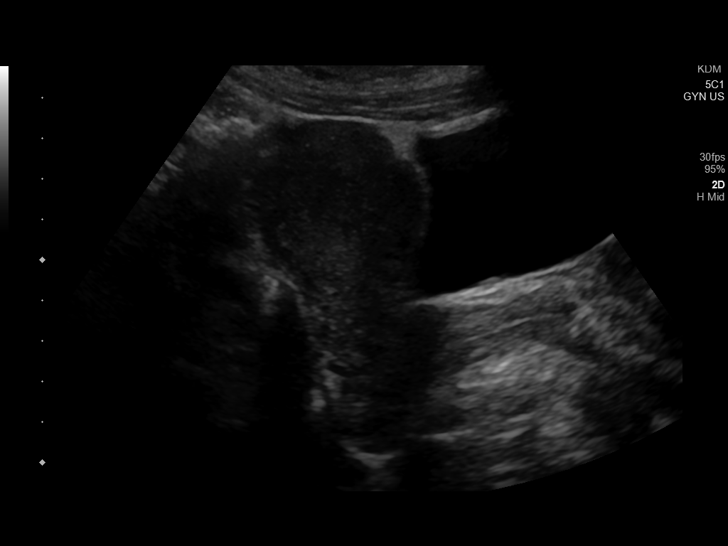
[im 9/104]
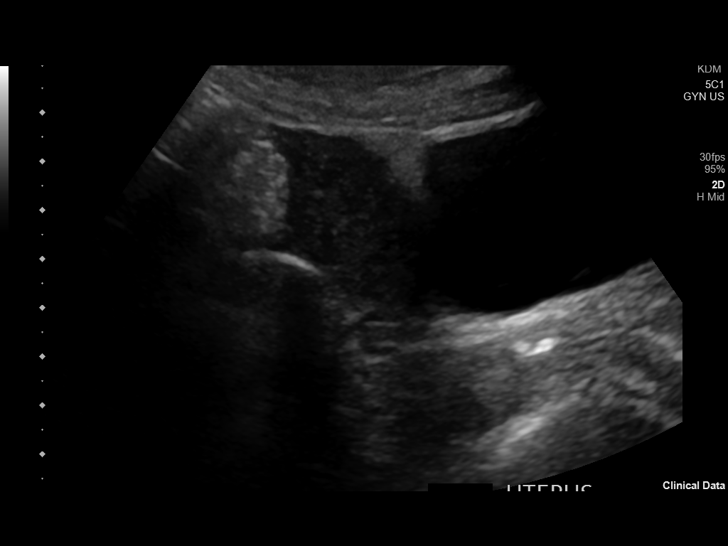
[im 18/104]
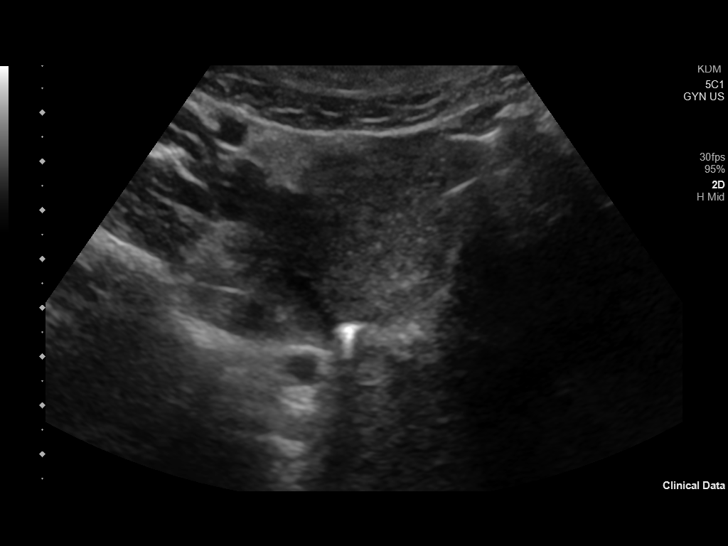
[im 26/104]
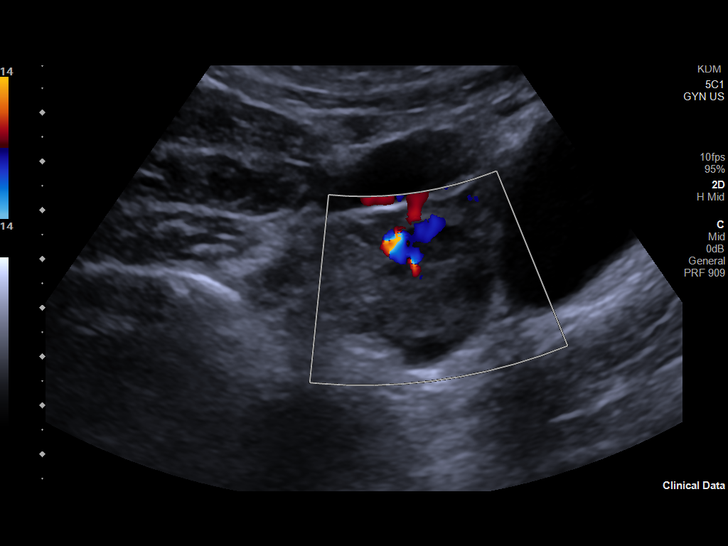
[im 35/104]
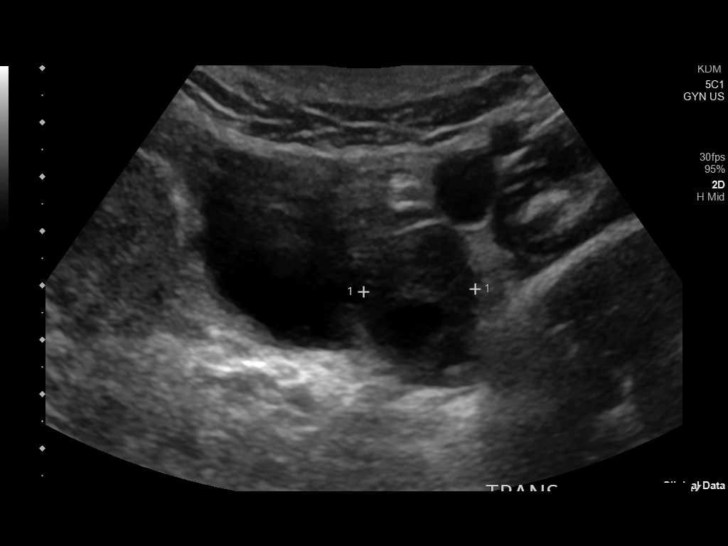
[im 43/104]
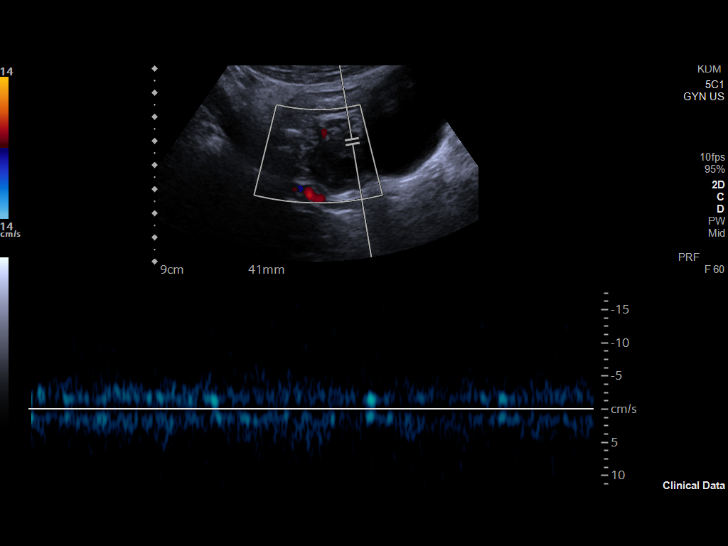
[im 52/104]
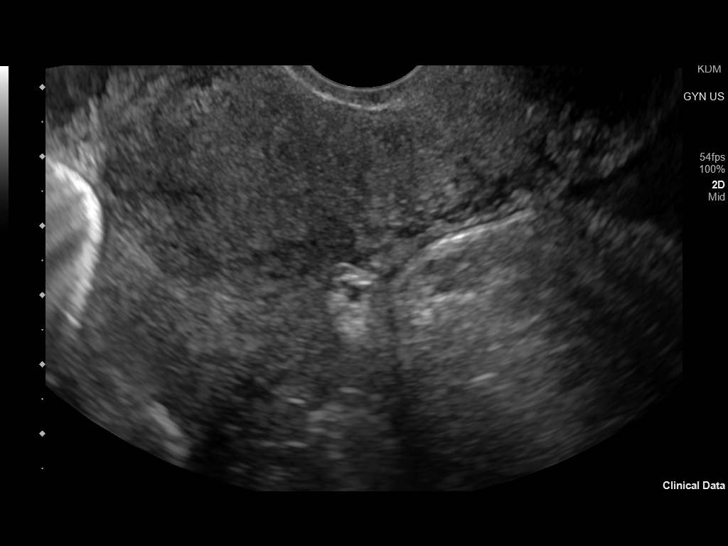
[im 61/104]
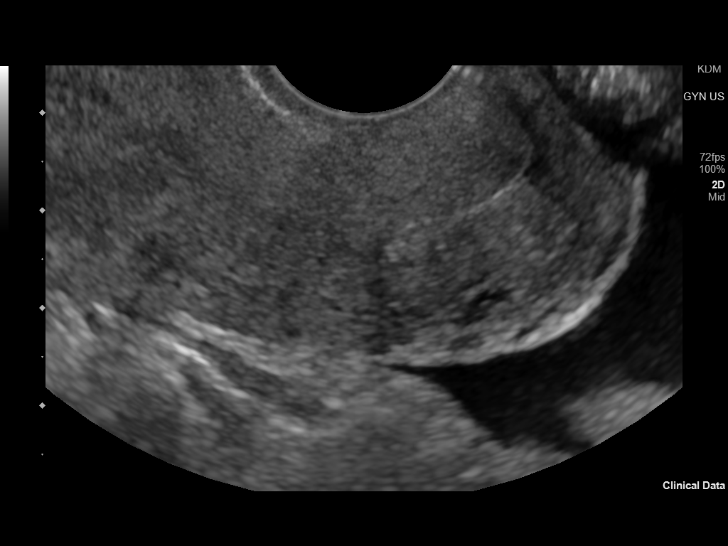
[im 69/104]
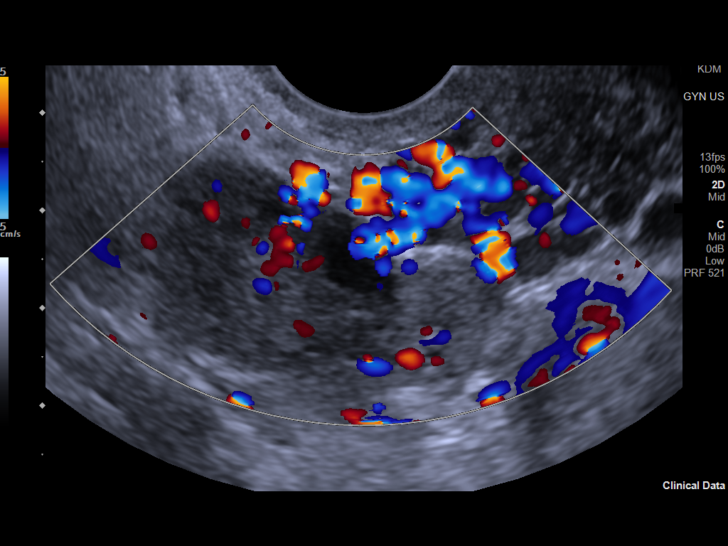
[im 78/104]
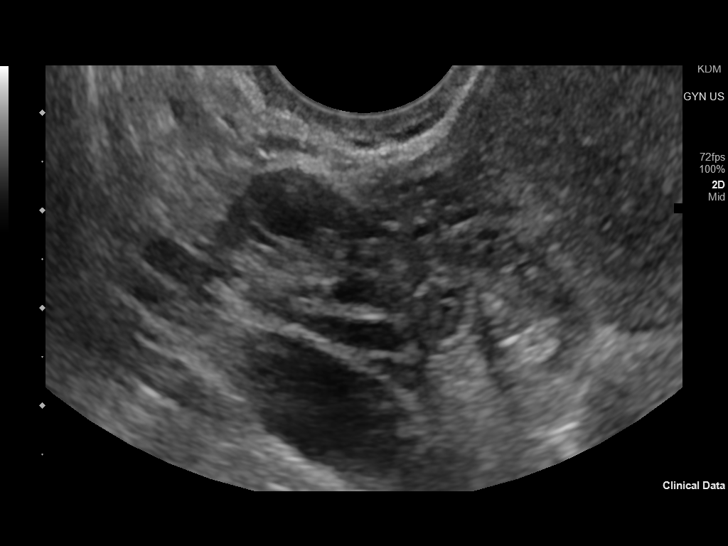
[im 86/104]
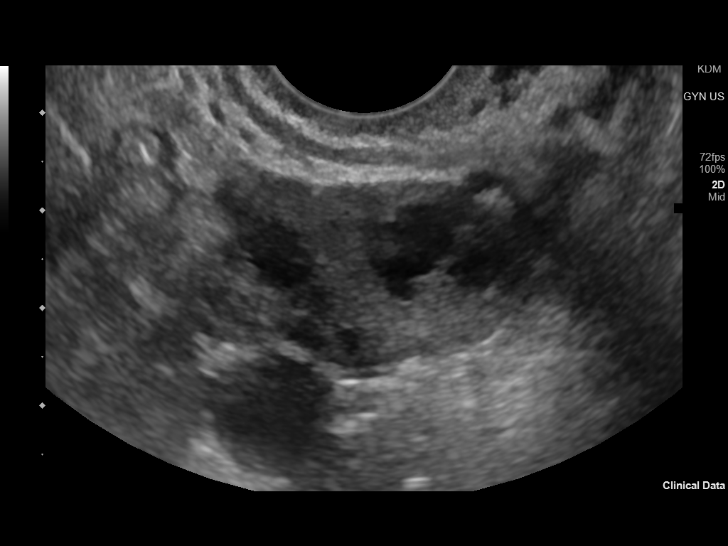
[im 95/104]
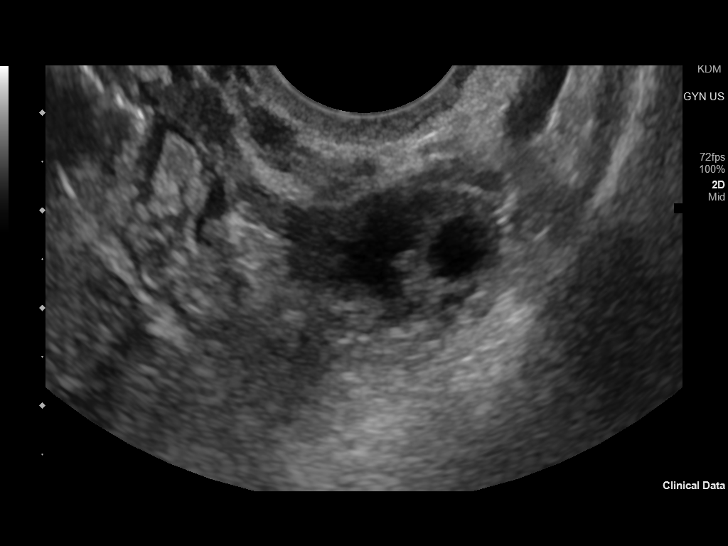
[im 104/104]
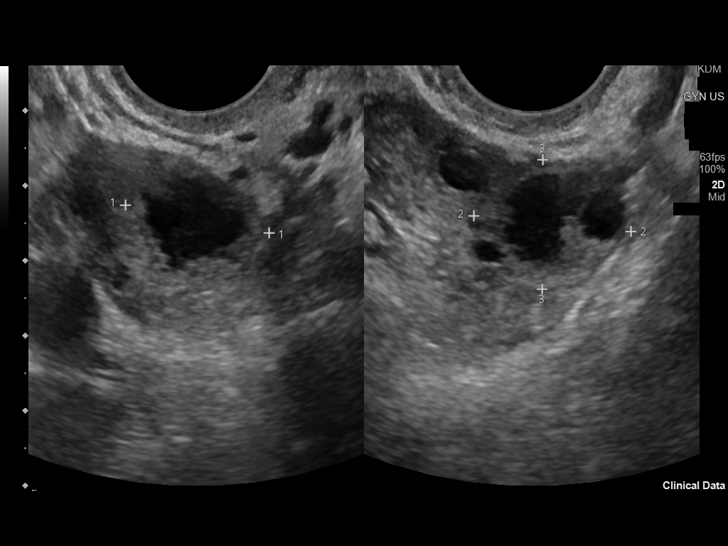

[13 of 25 positions shown; findings below may reference images not displayed]

FINDINGS: Uterus

Measurements: 8.7 x 3.9 x 3.6 cm = volume: 63 mL. The uterus is
anteverted. No fibroids or other mass visualized.

Endometrium

Thickness: 3 mm, normal.  No focal abnormality visualized.

Right ovary

Measurements: 2.9 x 1.8 x 2.0 cm = volume: 5.3 mL. Normal appearance
with normal blood flow. Physiologic follicles. No cyst or adnexal
mass.

Left ovary

Measurements: 3.3 x 1.9 x 2.2 cm = volume: 7.0 mL. Small complex
cyst measuring 2 cm with low level internal echoes, likely resolving
hemorrhagic or corpus luteal cyst. There is peripheral blood flow,
as well as normal blood flow to the ovarian parenchyma. No adnexal
mass.

Pulsed Doppler evaluation of both ovaries demonstrates normal
low-resistance arterial and venous waveforms.

Other findings

Small amount of simple free fluid in the cul-de-sac.
IMPRESSION: 1. Normal sonographic appearance of the uterus and endometrium.
2. Small complex cyst in the left ovary measuring 2 cm is likely
resolving hemorrhagic or corpus luteal cyst. This needs no dedicated
imaging follow-up or further characterization.
3. Normal sonographic appearance of the right ovary.
4. Normal ovarian blood flow without torsion.

## 2022-06-30 ENCOUNTER — Ambulatory Visit: Payer: Self-pay | Admitting: Obstetrics and Gynecology

## 2022-07-10 ENCOUNTER — Ambulatory Visit (INDEPENDENT_AMBULATORY_CARE_PROVIDER_SITE_OTHER): Payer: Self-pay | Admitting: Obstetrics and Gynecology

## 2022-07-10 ENCOUNTER — Encounter: Payer: Self-pay | Admitting: Obstetrics and Gynecology

## 2022-07-10 VITALS — BP 99/65 | HR 76 | Resp 15 | Ht 67.0 in | Wt 134.4 lb

## 2022-07-10 DIAGNOSIS — Z01419 Encounter for gynecological examination (general) (routine) without abnormal findings: Secondary | ICD-10-CM

## 2022-07-10 NOTE — Progress Notes (Signed)
HPI:      [name] is a 26 y.o. G1P1001 who LMP was Patient's last menstrual period was 07/05/2022 (exact date).  Subjective:   She presents today for her annual examination.  She is having normal regular menses.  She has been attempting pregnancy.  She has used ovulation predictor kits for the last 3 months which show she is ovulatory.  She has previously had a successful pregnancy.  She also has had an ectopic pregnancy treated with methotrexate.  Her current partner did not follow her first child but she did become pregnant with him for the ectopic.  She has been attempting pregnancy since April.    Hx: The following portions of the patient's history were reviewed and updated as appropriate:             She  has a past medical history of Heart murmur. She does not have a problem list on file. She  has no past surgical history on file. Her family history includes Healthy in her father and mother; Hypertension in her maternal grandfather. She  reports that she has never smoked. She has never used smokeless tobacco. She reports that she does not drink alcohol and does not use drugs. She has a current medication list which includes the following prescription(s): metronidazole. She is allergic to codeine.       Review of Systems:  Review of Systems  Constitutional: Denied constitutional symptoms, night sweats, recent illness, fatigue, fever, insomnia and weight loss.  Eyes: Denied eye symptoms, eye pain, photophobia, vision change and visual disturbance.  Ears/Nose/Throat/Neck: Denied ear, nose, throat or neck symptoms, hearing loss, nasal discharge, sinus congestion and sore throat.  Cardiovascular: Denied cardiovascular symptoms, arrhythmia, chest pain/pressure, edema, exercise intolerance, orthopnea and palpitations.  Respiratory: Denied pulmonary symptoms, asthma, pleuritic pain, productive sputum, cough, dyspnea and wheezing.  Gastrointestinal: Denied, gastro-esophageal reflux,  melena, nausea and vomiting.  Genitourinary: Denied genitourinary symptoms including symptomatic vaginal discharge, pelvic relaxation issues, and urinary complaints.  Musculoskeletal: Denied musculoskeletal symptoms, stiffness, swelling, muscle weakness and myalgia.  Dermatologic: Denied dermatology symptoms, rash and scar.  Neurologic: Denied neurology symptoms, dizziness, headache, neck pain and syncope.  Psychiatric: Denied psychiatric symptoms, anxiety and depression.  Endocrine: Denied endocrine symptoms including hot flashes and night sweats.   Meds:   Current Outpatient Medications on File Prior to Visit  Medication Sig Dispense Refill   metroNIDAZOLE (FLAGYL) 500 MG tablet Take 1 tablet (500 mg total) by mouth 2 (two) times daily. (Patient not taking: Reported on 07/10/2022) 14 tablet 0   No current facility-administered medications on file prior to visit.     Objective:     Vitals:   07/10/22 0848  BP: 99/65  Pulse: 76  Resp: 15  SpO2: 99%    Filed Weights   07/10/22 0848  Weight: 134 lb 6.4 oz (61 kg)              Physical examination General NAD, Conversant  HEENT Atraumatic; Op clear with mmm.  Normo-cephalic. Pupils reactive. Anicteric sclerae  Thyroid/Neck Smooth without nodularity or enlargement. Normal ROM.  Neck Supple.  Skin No rashes, lesions or ulceration. Normal palpated skin turgor. No nodularity.  Breasts: No masses or discharge.  Symmetric.  No axillary adenopathy.  Lungs: Clear to auscultation.No rales or wheezes. Normal Respiratory effort, no retractions.  Heart: NSR.  No murmurs or rubs appreciated. No periferal edema  Abdomen: Soft.  Non-tender.  No masses.  No HSM. No hernia  Extremities: Moves all  appropriately.  Normal ROM for age. No lymphadenopathy.  Neuro: Oriented to PPT.  Normal mood. Normal affect.     Pelvic:   Vulva: Normal appearance.  No lesions.  Vagina: No lesions or abnormalities noted.  Support: Normal pelvic support.   Urethra No masses tenderness or scarring.  Meatus Normal size without lesions or prolapse.  Cervix: Normal appearance.  No lesions.  Anus: Normal exam.  No lesions.  Perineum: Normal exam.  No lesions.        Bimanual   Uterus: Normal size.  Non-tender.  Mobile.  AV.  Adnexae: No masses.  Non-tender to palpation.  Cul-de-sac: Negative for abnormality.     Assessment:    G1P1001 There are no problems to display for this patient.    1. Well woman exam with routine gynecological exam     Has not truly met the definition of infertility yet.  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   Plan:            1.  Basic Screening Recommendations The basic screening recommendations for asymptomatic women were discussed with the patient during her visit.  The age-appropriate recommendations were discussed with her and the rational for the tests reviewed.  When I am informed by the patient that another primary care physician has previously obtained the age-appropriate tests and they are up-to-date, only outstanding tests are ordered and referrals given as necessary.  Abnormal results of tests will be discussed with her when all of her results are completed.  Routine preventative health maintenance measures emphasized: Exercise/Diet/Weight control, Tobacco Warnings, Alcohol/Substance use risks and Stress Management Patient and I have discussed infertility in detail.  My next recommendation would be for semen analysis followed by HSG.  She will let us know when she is wanting to proceed.  In the meantime she will continue with ovulation predictor kits.  I have also advised her to let me know as soon as she becomes pregnant so that we may get an early ultrasound.  Orders No orders of the defined types were placed in this encounter.   No orders of the defined types were placed in this encounter.         F/U  Return in about 1 year (around 07/11/2023) for  Annual Physical.  Finis Bud, M.D. 07/10/2022 9:44 AM

## 2022-07-22 ENCOUNTER — Encounter: Payer: Self-pay | Admitting: Emergency Medicine

## 2022-07-22 ENCOUNTER — Emergency Department
Admission: EM | Admit: 2022-07-22 | Discharge: 2022-07-22 | Disposition: A | Discharge status: Home / Self Care | Payer: Self-pay | Attending: Emergency Medicine | Admitting: Emergency Medicine

## 2022-07-22 ENCOUNTER — Other Ambulatory Visit: Payer: Self-pay

## 2022-07-22 DIAGNOSIS — R1030 Lower abdominal pain, unspecified: Secondary | ICD-10-CM

## 2022-07-22 DIAGNOSIS — R112 Nausea with vomiting, unspecified: Secondary | ICD-10-CM

## 2022-07-22 LAB — COMPREHENSIVE METABOLIC PANEL
ALT: 21 U/L (ref 0–44)
AST: 23 U/L (ref 15–41)
Albumin: 4.2 g/dL (ref 3.5–5.0)
Alkaline Phosphatase: 38 U/L (ref 38–126)
Anion gap: 10 (ref 5–15)
BUN: 11 mg/dL (ref 6–20)
CO2: 21 mmol/L — ABNORMAL LOW (ref 22–32)
Calcium: 9.2 mg/dL (ref 8.9–10.3)
Chloride: 105 mmol/L (ref 98–111)
Creatinine, Ser: 0.67 mg/dL (ref 0.44–1.00)
GFR, Estimated: >60 mL/min (ref >60)
Glucose, Bld: 87 mg/dL (ref 70–99)
Potassium: 3.8 mmol/L (ref 3.5–5.1)
Sodium: 136 mmol/L (ref 135–145)
Total Bilirubin: 0.9 mg/dL (ref 0.3–1.2)
Total Protein: 7.3 g/dL (ref 6.5–8.1)

## 2022-07-22 LAB — URINALYSIS, ROUTINE W REFLEX MICROSCOPIC
Bacteria, UA: NONE SEEN
Bilirubin Urine: NEGATIVE
Glucose, UA: NEGATIVE mg/dL
Ketones, ur: 80 mg/dL — AB
Leukocytes,Ua: NEGATIVE
Nitrite: NEGATIVE
Protein, ur: 100 mg/dL — AB
Specific Gravity, Urine: 1.03 (ref 1.005–1.030)
pH: 5 (ref 5.0–8.0)

## 2022-07-22 LAB — CBC
HCT: 38.6 % (ref 36.0–46.0)
Hemoglobin: 12.6 g/dL (ref 12.0–15.0)
MCH: 28.1 pg (ref 26.0–34.0)
MCHC: 32.6 g/dL (ref 30.0–36.0)
MCV: 86.2 fL (ref 80.0–100.0)
Platelets: 282 10*3/uL (ref 150–400)
RBC: 4.48 MIL/uL (ref 3.87–5.11)
RDW: 14.4 % (ref 11.5–15.5)
WBC: 12.7 10*3/uL — ABNORMAL HIGH (ref 4.0–10.5)
nRBC: 0 % (ref 0.0–0.2)

## 2022-07-22 LAB — LIPASE, BLOOD: Lipase: 28 U/L (ref 11–51)

## 2022-07-22 LAB — POC URINE PREG, ED: Preg Test, Ur: NEGATIVE

## 2022-07-22 MED ORDER — ONDANSETRON 4 MG PO TBDP: 4.0000 mg | ORAL_TABLET | Freq: Three times a day (TID) | ORAL | 0 refills | Status: DC | PRN

## 2022-07-22 NOTE — Discharge Instructions (Signed)
Follow-up with your regular doctor as needed.  Return if worsening. ?

## 2022-07-22 NOTE — ED Provider Notes (Signed)
[facility] Provider Note    Event Date/Time   First MD Initiated Contact with Patient 07/22/22 1557     (approximate)   History   Abdominal Pain and Emesis   HPI  Teresa Bishop is a 26 y.o. female with no significant past medical history presents emergency department stating she had some lower abdominal pain and vomiting this morning.  States she is now feeling better and is starting to get hungry and would like to go home to eat.  No diarrhea.  Has not noticed any dysuria or blood in her urine.     Physical Exam   Triage Vital Signs: ED Triage Vitals [07/22/22 1457]  Enc Vitals Group     BP 112/79     Pulse Rate 96     Resp 20     Temp 98.8 F (37.1 C)     Temp src      SpO2 100 %     Weight 132 lb (59.9 kg)     Height 5\' 7"  (1.702 m)     Head Circumference      Peak Flow      Pain Score 7     Pain Loc      Pain Edu?      Excl. in [place]?     Most recent vital signs: Vitals:   07/22/22 1457  BP: 112/79  Pulse: 96  Resp: 20  Temp: 98.8 F (37.1 C)  SpO2: 100%     General: Awake, no distress.   CV:  Good peripheral perfusion. regular rate and  rhythm Resp:  Normal effort. Lungs cta Abd:  No distention.  Minimally tender, no McBurney's point tenderness Other:      ED Results / Procedures / Treatments   Labs (all labs ordered are listed, but only abnormal results are displayed) Labs Reviewed  COMPREHENSIVE METABOLIC PANEL - Abnormal; Notable for the following components:      Result Value   CO2 21 (*)    All other components within normal limits  CBC - Abnormal; Notable for the following components:   WBC 12.7 (*)    All other components within normal limits  URINALYSIS, ROUTINE W REFLEX MICROSCOPIC - Abnormal; Notable for the following components:   Color, Urine YELLOW (*)    APPearance HAZY (*)    Hgb urine dipstick MODERATE (*)    Ketones, ur 80 (*)    Protein, ur 100 (*)    All other components within normal limits   LIPASE, BLOOD  POC URINE PREG, ED     EKG     RADIOLOGY     PROCEDURES:   Procedures   MEDICATIONS ORDERED IN ED: Medications - No data to display   IMPRESSION / MDM / [place] / ED COURSE  I reviewed the triage vital signs and the nursing notes.                              Differential diagnosis includes, but is not limited to, acute appendicitis, bowel obstruction, gastroenteritis  Patient's presentation is most consistent with acute complicated illness / injury requiring diagnostic workup.   Patient's labs are reassuring  In shared decision-making with the patient she decided to opt out of the CT of the abdomen pelvis.  She states she is feeling better is hungry.  She states I think I am getting better and can go home.  We did  go over signs and symptoms of appendicitis and the fact that she will need to return immediately for CT scan if she is worsening.  Did caution her that appendicitis can rupture and cause more harm.  She still would like to opt out of the CT and was discharged in stable condition.      FINAL CLINICAL IMPRESSION(S) / ED DIAGNOSES   Final diagnoses:  Lower abdominal pain  Nausea and vomiting, unspecified vomiting type     Rx / DC Orders   ED Discharge Orders          Ordered    ondansetron (ZOFRAN-ODT) 4 MG disintegrating tablet  Every 8 hours PRN        07/22/22 1626             Note:  This document was prepared using Dragon voice recognition software and may include unintentional dictation errors.    Faythe Ghee, PA-C 07/22/22 1630    Merwyn Katos, MD 07/22/22 2009

## 2022-07-22 NOTE — ED Triage Notes (Signed)
Pt via POV from home. Pt c/o lower abd pain and emesis since this AM. Denies abd surgeries. Denies fever. Denies urinary symptoms.

## 2023-06-05 ENCOUNTER — Emergency Department: Payer: Self-pay

## 2023-06-05 ENCOUNTER — Other Ambulatory Visit: Payer: Self-pay

## 2023-06-05 ENCOUNTER — Emergency Department
Admission: EM | Admit: 2023-06-05 | Discharge: 2023-06-05 | Disposition: A | Discharge status: Home / Self Care | Payer: Self-pay | Attending: Emergency Medicine | Admitting: Emergency Medicine

## 2023-06-05 ENCOUNTER — Telehealth: Payer: Self-pay

## 2023-06-05 DIAGNOSIS — N83202 Unspecified ovarian cyst, left side: Secondary | ICD-10-CM

## 2023-06-05 DIAGNOSIS — N83292 Other ovarian cyst, left side: Secondary | ICD-10-CM | POA: Insufficient documentation

## 2023-06-05 LAB — URINALYSIS, ROUTINE W REFLEX MICROSCOPIC
Bilirubin Urine: NEGATIVE
Glucose, UA: NEGATIVE mg/dL
Hgb urine dipstick: NEGATIVE
Ketones, ur: NEGATIVE mg/dL
Leukocytes,Ua: NEGATIVE
Nitrite: NEGATIVE
Protein, ur: 100 mg/dL — AB
Specific Gravity, Urine: 1.026 (ref 1.005–1.030)
pH: 9 — ABNORMAL HIGH (ref 5.0–8.0)

## 2023-06-05 LAB — COMPREHENSIVE METABOLIC PANEL
ALT: 20 U/L (ref 0–44)
AST: 27 U/L (ref 15–41)
Albumin: 4.3 g/dL (ref 3.5–5.0)
Alkaline Phosphatase: 35 U/L — ABNORMAL LOW (ref 38–126)
Anion gap: 10 (ref 5–15)
BUN: 11 mg/dL (ref 6–20)
CO2: 21 mmol/L — ABNORMAL LOW (ref 22–32)
Calcium: 9.1 mg/dL (ref 8.9–10.3)
Chloride: 107 mmol/L (ref 98–111)
Creatinine, Ser: 0.77 mg/dL (ref 0.44–1.00)
GFR, Estimated: >60 mL/min (ref >60)
Glucose, Bld: 135 mg/dL — ABNORMAL HIGH (ref 70–99)
Potassium: 3.5 mmol/L (ref 3.5–5.1)
Sodium: 138 mmol/L (ref 135–145)
Total Bilirubin: 0.7 mg/dL (ref 0.3–1.2)
Total Protein: 7.1 g/dL (ref 6.5–8.1)

## 2023-06-05 LAB — CBC
HCT: 37.1 % (ref 36.0–46.0)
Hemoglobin: 12.6 g/dL (ref 12.0–15.0)
MCH: 28.8 pg (ref 26.0–34.0)
MCHC: 34 g/dL (ref 30.0–36.0)
MCV: 84.7 fL (ref 80.0–100.0)
Platelets: 263 10*3/uL (ref 150–400)
RBC: 4.38 MIL/uL (ref 3.87–5.11)
RDW: 13.6 % (ref 11.5–15.5)
WBC: 10.5 10*3/uL (ref 4.0–10.5)
nRBC: 0 % (ref 0.0–0.2)

## 2023-06-05 LAB — POC URINE PREG, ED: Preg Test, Ur: NEGATIVE

## 2023-06-05 LAB — LIPASE, BLOOD: Lipase: 26 U/L (ref 11–51)

## 2023-06-05 MED ORDER — KETOROLAC TROMETHAMINE 15 MG/ML IJ SOLN
15.0000 mg | Freq: Once | INTRAMUSCULAR | Status: AC
  Administered 2023-06-05: 15 mg via INTRAVENOUS
  Filled 2023-06-05: qty 1

## 2023-06-05 MED ORDER — ONDANSETRON 4 MG PO TBDP: 4.0000 mg | ORAL_TABLET | Freq: Three times a day (TID) | ORAL | 0 refills | Status: DC | PRN

## 2023-06-05 MED ORDER — ONDANSETRON HCL 4 MG/2ML IJ SOLN
4.0000 mg | Freq: Once | INTRAMUSCULAR | Status: AC
  Administered 2023-06-05: 4 mg via INTRAVENOUS
  Filled 2023-06-05: qty 2

## 2023-06-05 NOTE — ED Provider Notes (Signed)
[facility] Provider Note    Event Date/Time   First MD Initiated Contact with Patient 06/05/23 0825     (approximate)   History   Abdominal Pain   HPI  Teresa Bishop is a 27 y.o. female presents to the emergency department with abdominal pain.  Abdominal pain started last night around 9 PM.  Progressively got worse and then woke up at 2 AM with multiple episodes of vomiting.  Currently rates her pain a 7 out of 10.  Constant pain to her lower abdomen.  Worse in the middle and not on 1 side.  Prior ectopic pregnancy that was treated medically.  Prior ruptured ovarian cyst.  No history of kidney stones.  No prior abdominal surgery.  Denies any dysuria, urinary urgency or frequency.  Denies any vaginal discharge or concern for an STI.     Physical Exam   Triage Vital Signs: ED Triage Vitals  Encounter Vitals Group     BP 06/05/23 0721 115/74     Systolic BP Percentile --      Diastolic BP Percentile --      Pulse Rate 06/05/23 0721 79     Resp 06/05/23 0721 18     Temp 06/05/23 0721 97.6 F (36.4 C)     Temp Source 06/05/23 0721 Oral     SpO2 06/05/23 0721 100 %     Weight 06/05/23 0719 130 lb (59 kg)     Height 06/05/23 0719 5\' 7"  (1.702 m)     Head Circumference --      Peak Flow --      Pain Score 06/05/23 0719 8     Pain Loc --      Pain Education --      Exclude from Growth Chart --     Most recent vital signs: Vitals:   06/05/23 0828 06/05/23 1057  BP:  105/62  Pulse: 68 65  Resp:  18  Temp:    SpO2: 100% 100%    Physical Exam Constitutional:      Appearance: She is well-developed.  HENT:     Head: Atraumatic.  Eyes:     Conjunctiva/sclera: Conjunctivae normal.  Cardiovascular:     Rate and Rhythm: Regular rhythm.  Pulmonary:     Effort: No respiratory distress.  Abdominal:     General: There is no distension.     Tenderness: There is abdominal tenderness in the suprapubic area.  Musculoskeletal:        General: Normal  range of motion.     Cervical back: Normal range of motion.  Skin:    General: Skin is warm.  Neurological:     Mental Status: She is alert. Mental status is at baseline.     IMPRESSION / MDM / ASSESSMENT AND PLAN / ED COURSE  I reviewed the triage vital signs and the nursing notes.  Differential diagnosis including ovarian torsion, ruptured cyst, appendicitis, urinary tract infection, kidney stone  No tachycardic or bradycardic dysrhythmias while on cardiac telemetry.  RADIOLOGY I independently reviewed imaging, my interpretation of imaging: Transvaginal ultrasound -cyst to the ovary.  Right is complex cyst to the left ovary concerning for hemorrhagic cyst measuring 2.2 cm.  Good flow to both ovaries and no signs of torsion.  LABS (all labs ordered are listed, but only abnormal results are displayed) Labs interpreted as -    Labs Reviewed  COMPREHENSIVE METABOLIC PANEL - Abnormal; Notable for the following components:  Result Value   CO2 21 (*)    Glucose, Bld 135 (*)    Alkaline Phosphatase 35 (*)    All other components within normal limits  URINALYSIS, ROUTINE W REFLEX MICROSCOPIC - Abnormal; Notable for the following components:   Color, Urine YELLOW (*)    APPearance HAZY (*)    pH 9.0 (*)    Protein, ur 100 (*)    Bacteria, UA RARE (*)    All other components within normal limits  LIPASE, BLOOD  CBC  POC URINE PREG, ED     MDM    Pregnancy test is negative.  UA with no signs of urinary tract infection.  Mild leukocytosis.  Creatinine at baseline with no significant electrolyte abnormality.  Patient was given IV ketorolac and Zofran.  Will start with transvaginal ultrasound to evaluate for torsion.  On reevaluation patient states she is feeling much better and not in any pain at this time.  No further episodes of nausea and vomiting.  Ultrasound concerning for hemorrhagic cyst on the left side.  Discussed symptomatic treatment and follow-up with her  gynecologist Dr. Logan Bores.  Discussed that she needs a follow-up ultrasound done whenever she is not having symptoms to make sure that she has resolution of the cyst.  Given return precautions for any worsening symptoms.   PROCEDURES:  Critical Care performed: No  Procedures  Patient's presentation is most consistent with acute presentation with potential threat to life or bodily function.   MEDICATIONS ORDERED IN ED: Medications  ondansetron (ZOFRAN) injection 4 mg (4 mg Intravenous Given 06/05/23 0926)  ketorolac (TORADOL) 15 MG/ML injection 15 mg (15 mg Intravenous Given 06/05/23 0926)    FINAL CLINICAL IMPRESSION(S) / ED DIAGNOSES   Final diagnoses:  Hemorrhagic cyst of left ovary     Rx / DC Orders   ED Discharge Orders          Ordered    ondansetron (ZOFRAN-ODT) 4 MG disintegrating tablet  Every 8 hours PRN        06/05/23 1250             Note:  This document was prepared using Dragon voice recognition software and may include unintentional dictation errors.   Corena Herter, MD 06/05/23 1254

## 2023-06-05 NOTE — Discharge Instructions (Signed)
You were seen in the emergency department for left-sided abdominal pain.  You had an ultrasound done that showed a hemorrhagic cyst on the left side measuring 2.2 cm.  You had no signs of urinary tract infection.  Call and follow-up closely with your gynecologist Dr. Logan Bores.  Pain control:  Ibuprofen (motrin/aleve/advil) - You can take 3 tablets (600 mg) every 6 hours as needed for pain/fever.  Acetaminophen (tylenol) - You can take 2 extra strength tablets (1000 mg) every 6 hours as needed for pain/fever.  You can alternate these medications or take them together.  Make sure you eat food/drink water when taking these medications.  zofran (ondansetron) - nausea medication, take 1 tablet every 8 hours as needed for nausea/vomiting.  Thank you for choosing [place] for your health care, it was my pleasure to care for you today!  Corena Herter, MD

## 2023-06-05 NOTE — Telephone Encounter (Signed)
Patient seen in ED early this am.  Teresa Bishop is a 27 y.o. female presents to the emergency department with abdominal pain.  Abdominal pain started last night around 9 PM.  Progressively got worse and then woke up at 2 AM with multiple episodes of vomiting.  Currently rates her pain a 7 out of 10.  Constant pain to her lower abdomen.  Worse in the middle and not on 1 side.  Prior ectopic pregnancy that was treated medically.  Prior ruptured ovarian cyst.  No history of kidney stones.  No prior abdominal surgery.  Denies any dysuria, urinary urgency or frequency.  Denies any vaginal discharge or concern for an STI.  Patient was given IV ketorolac and Zofran. Will start with transvaginal ultrasound to evaluate for torsion.  On reevaluation patient states she is feeling much better and not in any pain at this time. No further episodes of nausea and vomiting. Ultrasound concerning for hemorrhagic cyst on the left side. Discussed symptomatic treatment and follow-up with her gynecologist Dr. Logan Bores. Discussed that she needs a follow-up ultrasound done whenever she is not having symptoms to make sure that she has resolution of the cyst.   Sent home w/Zofran. Patient reports Toradol is wearing off. She has scheduled appointment for follow up with Dr. Logan Bores 06/11/23. Requesting rx for pain meds as she did not receive anything at ED.  She has not tried anything OTC for pain. Advised can take OTC Ibuprofen 400 mg q 4hr/600 mg q6 hr/800 mg q8 hr as needed. She will try this and reach back out if pain is unrelieved with this method. She is aware to call the office after business hours to speak to after hours/on call service.

## 2023-06-05 NOTE — ED Triage Notes (Signed)
Pt comes with c/o mid lower abdominal pain that started last night and into this morning. Pt states nausea and vomiting. Pt denies any urinary symptoms. Pt states 8/10 pain.

## 2023-06-11 ENCOUNTER — Encounter: Payer: Self-pay | Admitting: Obstetrics and Gynecology

## 2023-06-11 DIAGNOSIS — D219 Benign neoplasm of connective and other soft tissue, unspecified: Secondary | ICD-10-CM

## 2023-06-11 DIAGNOSIS — N83202 Unspecified ovarian cyst, left side: Secondary | ICD-10-CM

## 2023-06-17 ENCOUNTER — Encounter: Payer: Self-pay | Admitting: Obstetrics and Gynecology

## 2023-06-17 DIAGNOSIS — R109 Unspecified abdominal pain: Secondary | ICD-10-CM

## 2023-06-17 DIAGNOSIS — N83202 Unspecified ovarian cyst, left side: Secondary | ICD-10-CM

## 2023-06-25 ENCOUNTER — Ambulatory Visit (INDEPENDENT_AMBULATORY_CARE_PROVIDER_SITE_OTHER): Payer: Self-pay | Admitting: Obstetrics and Gynecology

## 2023-06-25 ENCOUNTER — Encounter: Payer: Self-pay | Admitting: Obstetrics and Gynecology

## 2023-06-25 VITALS — BP 111/74 | HR 73 | Ht 67.0 in | Wt 136.7 lb

## 2023-06-25 DIAGNOSIS — N83202 Unspecified ovarian cyst, left side: Secondary | ICD-10-CM

## 2023-06-25 NOTE — Progress Notes (Signed)
HPI:      [name] is a 27 y.o. G2P1011 who LMP was Patient's last menstrual period was 06/07/2023.  Subjective:   She presents today after being seen in the ED for pelvic pain.  Since that time her pain has completely resolved and she feels fine.  Ultrasound showed possibility of a 2 cm hemorrhagic cyst on her ovary. She has been attempting pregnancy for 8 to 12 months.  Some months more dedicated than others.  Some months using ovulation predictor kits.  She says she is ovulatory at the same time each month based on those kits.  Of significant note, she has previously had an ectopic pregnancy managed by methotrexate.  Prior to that she had a normal pregnancy. Her partner has not undergone semen analysis yet.    Hx: The following portions of the patient's history were reviewed and updated as appropriate:             She  has a past medical history of Ectopic pregnancy and Heart murmur. She does not have a problem list on file. She  has no past surgical history on file. Her family history includes Healthy in her father and mother; Hypertension in her maternal grandfather. She  reports that she has never smoked. She has never used smokeless tobacco. She reports current alcohol use. She reports current drug use. Drug: Marijuana. She currently has no medications in their medication list. She is allergic to codeine.       Review of Systems:  Review of Systems  Constitutional: Denied constitutional symptoms, night sweats, recent illness, fatigue, fever, insomnia and weight loss.  Eyes: Denied eye symptoms, eye pain, photophobia, vision change and visual disturbance.  Ears/Nose/Throat/Neck: Denied ear, nose, throat or neck symptoms, hearing loss, nasal discharge, sinus congestion and sore throat.  Cardiovascular: Denied cardiovascular symptoms, arrhythmia, chest pain/pressure, edema, exercise intolerance, orthopnea and palpitations.  Respiratory: Denied pulmonary symptoms, asthma,  pleuritic pain, productive sputum, cough, dyspnea and wheezing.  Gastrointestinal: Denied, gastro-esophageal reflux, melena, nausea and vomiting.  Genitourinary: See HPI for additional information.  Musculoskeletal: Denied musculoskeletal symptoms, stiffness, swelling, muscle weakness and myalgia.  Dermatologic: Denied dermatology symptoms, rash and scar.  Neurologic: Denied neurology symptoms, dizziness, headache, neck pain and syncope.  Psychiatric: Denied psychiatric symptoms, anxiety and depression.  Endocrine: Denied endocrine symptoms including hot flashes and night sweats.   Meds:   No current outpatient medications on file prior to visit.   No current facility-administered medications on file prior to visit.      Objective:     Vitals:   06/25/23 0816  BP: 111/74  Pulse: 73   Filed Weights   06/25/23 0816  Weight: 136 lb 11.2 oz (62 kg)                        Assessment:    G2P1011 There are no problems to display for this patient.    1. Hemorrhagic cyst of left ovary     Patient was having pelvic pain but this has resolved.    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   Plan:            1.  Expectant management of pelvic pain-now that is resolved I believe no follow-up is warranted.  2.  Semen analysis kit given.  Patient to have this in the near future.  3.  Consider HSG as next step if semen analysis normal.  We  have discussed this in detail. Orders No orders of the defined types were placed in this encounter.   No orders of the defined types were placed in this encounter.     F/U  Return for Annual Physical. I spent 22 minutes involved in the care of this patient preparing to see the patient by obtaining and reviewing her medical history (including labs, imaging tests and prior procedures), documenting clinical information in the electronic health record (EHR), counseling and coordinating care plans,  writing and sending prescriptions, ordering tests or procedures and in direct communicating with the patient and medical staff discussing pertinent items from her history and physical exam.  Elonda Husky, M.D. 06/25/2023 8:38 AM

## 2023-06-25 NOTE — Progress Notes (Signed)
Patient presents today for an ED follow-up. She had abdominal pain that led her to the ED where she was told she has a ovarian cyst. She reports the pain has resolved. Patient is not currently using birth control, she would like to become pregnant.

## 2023-08-28 ENCOUNTER — Encounter: Payer: Self-pay | Admitting: Obstetrics and Gynecology

## 2024-09-09 ENCOUNTER — Ambulatory Visit: Payer: Self-pay

## 2024-09-16 ENCOUNTER — Ambulatory Visit: Payer: Self-pay

## 2024-09-16 VITALS — BP 121/73 | HR 104 | Ht 67.0 in | Wt 135.6 lb

## 2024-09-16 DIAGNOSIS — N926 Irregular menstruation, unspecified: Secondary | ICD-10-CM

## 2024-09-16 DIAGNOSIS — O3680X0 Pregnancy with inconclusive fetal viability, not applicable or unspecified: Secondary | ICD-10-CM

## 2024-09-16 LAB — POCT URINE PREGNANCY: Preg Test, Ur: POSITIVE — AB

## 2024-09-16 NOTE — Patient Instructions (Signed)
 First Trimester of Pregnancy: What to Know  The first trimester of pregnancy starts on the first day of your last monthly period until the end of week 13. This is months 1 through 3 of pregnancy. A week after a sperm fertilizes an egg, the egg will implant into the wall of the uterus and begin to develop into a baby. Body changes during your first trimester Your body goes through many changes during pregnancy. The changes usually return to normal after your baby is born. Physical changes Your breasts may grow larger and may hurt. The area around your nipples may get darker. Your periods will stop. Your hair and nails may grow faster. You may pee more often. Health changes You may tire easily. Your gums may bleed and may be sensitive when you brush and floss. You may not feel hungry. You may have heartburn. You may throw up or feel like you may throw up. You may want to eat some foods, but not others. You may have headaches. You may have trouble pooping (constipation). Other changes Your emotions may change from day to day. You may have more dreams. Follow these instructions at home: Medicines Talk to your health care provider if you're taking medicines. Ask if the medicines are safe to take during pregnancy. Your provider may change the medicines that you take. Do not take any medicines unless told to by your provider. Take a prenatal vitamin that has at least 600 micrograms (mcg) of folic acid. Do not use herbal medicines, illegal substances, or medicines that are not approved by your provider. Eating and drinking While you're pregnant your body needs extra food for your growing baby. Talk with your provider about what to eat while pregnant. Activity Most women are able to exercise during pregnancy. Exercises may need to change as your pregnancy goes on. Talk to your provider about your activities and exercise routines. Relieving pain and discomfort Wear a good, supportive bra if  your breasts hurt. Rest with your legs raised if you have leg cramps or low back pain. Safety Wear your seatbelt at all times when you're in a car. Talk to your provider if someone hits you, hurts you, or yells at you. Talk with your provider if you're feeling sad or have thoughts of hurting yourself. Lifestyle Certain things can be harmful while you're pregnant. Follow these rules: Do not use hot tubs, steam rooms, or saunas. Do not douche. Do not use tampons or scented pads. Do not drink alcohol,smoke, vape, or use products with nicotine or tobacco in them. If you need help quitting, talk with your provider. Avoid cat litter boxes and soil used by cats. These things carry germs that can cause harm to your pregnancy and your baby. General instructions Keep all follow-up visits. It helps you and your unborn baby stay as healthy as possible. Write down your questions. Take them to your visits. Your provider will: Talk with you about your overall health. Give you advice or refer you to specialists who can help with different needs, including: Prenatal education classes. Mental health and counseling. Foods and healthy eating. Ask for help if you need help with food. Call your dentist and ask to be seen. Brush your teeth with a soft toothbrush. Floss gently. Where to find more information American Pregnancy Association: americanpregnancy.org [employer]: acog.org Office on [employer] Health: [place] Contact a health care provider if: You feel dizzy, faint, or have a fever. You vomit or have watery poop (  diarrhea) for 2 days or more. You have abnormal discharge or bleeding from your vagina. You have pain when you pee or your pee smells bad. You have cramps, pain, or pressure in your belly area. Get help right away if: You have trouble breathing or chest pain. You have any kind of injury, such as from a fall or a car crash. These symptoms may  be an emergency. Get help right away. Call 911. Do not wait to see if the symptoms will go away. Do not drive yourself to the hospital. This information is not intended to replace advice given to you by your health care provider. Make sure you discuss any questions you have with your health care provider. Document Revised: 06/06/2024 Document Reviewed: 11/28/2022 Elsevier Patient Education  2025 [employer].

## 2024-09-16 NOTE — Progress Notes (Signed)
" ° ° °  NURSE VISIT NOTE  Subjective:    Patient ID: Teresa Bishop, female    DOB: February 03, 1996, 29 y.o.   MRN: [id number]  HPI  Patient is a 29 y.o. [id number] female who presents for evaluation of amenorrhea. She believes she could be pregnant. Pregnancy is desired. Sexual Activity: single partner, contraception: none. Current symptoms also include: morning sickness, nausea, positive home pregnancy test, and fatigue. Last period was normal.    Objective:    BP 121/73   Pulse (!) 104   Ht 5' 7 (1.702 m)   Wt 135 lb 9.6 oz (61.5 kg)   LMP 08/05/2024 (Exact Date)   BMI 21.24 kg/m   Lab Review  Results for orders placed or performed in visit on 09/16/24  POCT urine pregnancy  Result Value Ref Range   Preg Test, Ur Positive (A) Negative    Assessment:   1. Missed period   2. Encounter to determine fetal viability of pregnancy, single or unspecified fetus     Plan:   Pregnancy Test: Positive  Estimated Date of Delivery: 05/12/25. Encouraged well-balanced diet, plenty of rest when needed, pre-natal vitamins daily and walking for exercise.  Discussed self-help for nausea, avoiding OTC medications until consulting provider or pharmacist, other than Tylenol  as needed, minimal caffeine (1-2 cups daily) and avoiding alcohol.   She will schedule her nurse visit @ 7-[redacted] wks pregnant, u/s for dating @10  wk, and NOB visit at [redacted] wk pregnant.    Feel free to call with any questions.    Rollo FORBES Louder, RN   "

## 2024-09-30 ENCOUNTER — Telehealth

## 2024-10-14 ENCOUNTER — Other Ambulatory Visit

## 2024-10-28 ENCOUNTER — Encounter: Admitting: Obstetrics
